# Patient Record
Sex: Female | Born: 1935 | Race: White | Hispanic: No | Marital: Married | Smoking: Never smoker
Health system: Southern US, Community
[De-identification: ages and names within clinical notes are randomized; demographics above are authoritative.]

## PROBLEM LIST (undated history)

## (undated) DIAGNOSIS — I10 Essential (primary) hypertension: Secondary | ICD-10-CM

## (undated) DIAGNOSIS — E039 Hypothyroidism, unspecified: Secondary | ICD-10-CM

## (undated) DIAGNOSIS — F419 Anxiety disorder, unspecified: Secondary | ICD-10-CM

## (undated) DIAGNOSIS — E079 Disorder of thyroid, unspecified: Secondary | ICD-10-CM

## (undated) DIAGNOSIS — R569 Unspecified convulsions: Secondary | ICD-10-CM

## (undated) DIAGNOSIS — F0390 Unspecified dementia without behavioral disturbance: Secondary | ICD-10-CM

## (undated) HISTORY — DX: Unspecified convulsions: R56.9

## (undated) HISTORY — PX: NO PAST SURGERIES: SHX2092

---

## 1998-04-28 ENCOUNTER — Other Ambulatory Visit: Admission: RE | Admit: 1998-04-28 | Discharge: 1998-04-28 | Payer: Self-pay

## 1999-06-25 ENCOUNTER — Other Ambulatory Visit: Admission: RE | Admit: 1999-06-25 | Discharge: 1999-06-25 | Payer: Self-pay | Admitting: Internal Medicine

## 2000-07-04 ENCOUNTER — Other Ambulatory Visit: Admission: RE | Admit: 2000-07-04 | Discharge: 2000-07-04 | Payer: Self-pay | Admitting: Internal Medicine

## 2001-08-03 ENCOUNTER — Encounter: Payer: Self-pay | Admitting: Emergency Medicine

## 2001-08-03 ENCOUNTER — Emergency Department (HOSPITAL_COMMUNITY): Admission: EM | Admit: 2001-08-03 | Discharge: 2001-08-03 | Payer: Self-pay | Admitting: Emergency Medicine

## 2002-12-03 ENCOUNTER — Emergency Department (HOSPITAL_COMMUNITY): Admission: EM | Admit: 2002-12-03 | Discharge: 2002-12-03 | Payer: Self-pay | Admitting: Emergency Medicine

## 2002-12-03 ENCOUNTER — Encounter: Payer: Self-pay | Admitting: Emergency Medicine

## 2006-09-19 ENCOUNTER — Ambulatory Visit (HOSPITAL_COMMUNITY): Admission: RE | Admit: 2006-09-19 | Discharge: 2006-09-19 | Payer: Self-pay | Admitting: Internal Medicine

## 2006-10-06 ENCOUNTER — Ambulatory Visit: Payer: Self-pay | Admitting: Internal Medicine

## 2007-09-23 ENCOUNTER — Encounter: Admission: RE | Admit: 2007-09-23 | Discharge: 2007-09-23 | Payer: Self-pay | Admitting: Internal Medicine

## 2009-08-28 ENCOUNTER — Inpatient Hospital Stay (HOSPITAL_COMMUNITY): Admission: EM | Admit: 2009-08-28 | Discharge: 2009-09-01 | Payer: Self-pay | Admitting: Emergency Medicine

## 2009-12-10 ENCOUNTER — Inpatient Hospital Stay (HOSPITAL_COMMUNITY): Admission: RE | Admit: 2009-12-10 | Discharge: 2009-12-12 | Payer: Self-pay | Admitting: Orthopedic Surgery

## 2010-08-15 LAB — CBC
MCH: 30.7 pg (ref 26.0–34.0)
Platelets: 105 10*3/uL — ABNORMAL LOW (ref 150–400)
RDW: 13.3 % (ref 11.5–15.5)
WBC: 5.7 10*3/uL (ref 4.0–10.5)

## 2010-08-15 LAB — BASIC METABOLIC PANEL
Chloride: 105 mEq/L (ref 96–112)
GFR calc Af Amer: >60 mL/min (ref >60)
GFR calc non Af Amer: >60 mL/min (ref >60)
Glucose, Bld: 137 mg/dL — ABNORMAL HIGH (ref 70–99)
Potassium: 3.9 mEq/L (ref 3.5–5.1)

## 2010-08-16 LAB — TYPE AND SCREEN
ABO/RH(D): A POS
Antibody Screen: NEGATIVE

## 2010-08-16 LAB — BASIC METABOLIC PANEL
BUN: 12 mg/dL (ref 6–23)
BUN: 13 mg/dL (ref 6–23)
CO2: 30 mEq/L (ref 19–32)
Calcium: 8.7 mg/dL (ref 8.4–10.5)
Calcium: 9.6 mg/dL (ref 8.4–10.5)
Creatinine, Ser: 0.76 mg/dL (ref 0.4–1.2)
GFR calc Af Amer: >60 mL/min (ref >60)
GFR calc non Af Amer: >60 mL/min (ref >60)
Potassium: 4 mEq/L (ref 3.5–5.1)
Sodium: 138 mEq/L (ref 135–145)
Sodium: 140 mEq/L (ref 135–145)

## 2010-08-16 LAB — SURGICAL PCR SCREEN: MRSA, PCR: NEGATIVE

## 2010-08-16 LAB — ABO/RH: ABO/RH(D): A POS

## 2010-08-16 LAB — CBC
MCH: 30.7 pg (ref 26.0–34.0)
MCV: 89.9 fL (ref 78.0–100.0)
RBC: 4.7 MIL/uL (ref 3.87–5.11)
RDW: 13.4 % (ref 11.5–15.5)
WBC: 5.9 10*3/uL (ref 4.0–10.5)
WBC: 7.2 10*3/uL (ref 4.0–10.5)

## 2010-08-16 LAB — URINALYSIS, ROUTINE W REFLEX MICROSCOPIC
Glucose, UA: NEGATIVE mg/dL
Nitrite: NEGATIVE
Protein, ur: NEGATIVE mg/dL
Urobilinogen, UA: 0.2 mg/dL (ref 0.0–1.0)
pH: 5.5 (ref 5.0–8.0)

## 2010-08-16 LAB — URINE MICROSCOPIC-ADD ON

## 2010-08-16 LAB — PROTIME-INR
INR: 1.01 (ref 0.00–1.49)
Prothrombin Time: 13.2 seconds (ref 11.6–15.2)

## 2010-08-16 LAB — DIFFERENTIAL
Eosinophils Absolute: 0 10*3/uL (ref 0.0–0.7)
Neutro Abs: 3.7 10*3/uL (ref 1.7–7.7)

## 2010-08-19 LAB — PROTIME-INR
INR: 1.52 — ABNORMAL HIGH (ref 0.00–1.49)
Prothrombin Time: 14.1 seconds (ref 11.6–15.2)
Prothrombin Time: 18.6 seconds — ABNORMAL HIGH (ref 11.6–15.2)

## 2010-08-19 LAB — BASIC METABOLIC PANEL
CO2: 27 mEq/L (ref 19–32)
CO2: 29 mEq/L (ref 19–32)
Calcium: 8.3 mg/dL — ABNORMAL LOW (ref 8.4–10.5)
Chloride: 104 mEq/L (ref 96–112)
Chloride: 106 mEq/L (ref 96–112)
Creatinine, Ser: 0.6 mg/dL (ref 0.4–1.2)
Creatinine, Ser: 0.66 mg/dL (ref 0.4–1.2)
GFR calc Af Amer: >60 mL/min (ref >60)
GFR calc Af Amer: >60 mL/min (ref >60)
Glucose, Bld: 116 mg/dL — ABNORMAL HIGH (ref 70–99)
Glucose, Bld: 196 mg/dL — ABNORMAL HIGH (ref 70–99)
Sodium: 136 mEq/L (ref 135–145)
Sodium: 138 mEq/L (ref 135–145)

## 2010-08-19 LAB — CBC
HCT: 32.7 % — ABNORMAL LOW (ref 36.0–46.0)
MCHC: 34 g/dL (ref 30.0–36.0)
Platelets: 130 10*3/uL — ABNORMAL LOW (ref 150–400)
RBC: 3.58 MIL/uL — ABNORMAL LOW (ref 3.87–5.11)
RDW: 12.8 % (ref 11.5–15.5)

## 2010-08-24 LAB — COMPREHENSIVE METABOLIC PANEL
ALT: 16 U/L (ref 0–35)
AST: 24 U/L (ref 0–37)
Alkaline Phosphatase: 86 U/L (ref 39–117)
CO2: 27 mEq/L (ref 19–32)
Chloride: 104 mEq/L (ref 96–112)
GFR calc Af Amer: >60 mL/min (ref >60)
GFR calc non Af Amer: >60 mL/min (ref >60)
Sodium: 139 mEq/L (ref 135–145)
Total Bilirubin: 0.6 mg/dL (ref 0.3–1.2)

## 2010-08-24 LAB — CBC
Hemoglobin: 14.2 g/dL (ref 12.0–15.0)
RBC: 4.61 MIL/uL (ref 3.87–5.11)
WBC: 6.4 10*3/uL (ref 4.0–10.5)

## 2010-08-24 LAB — URINALYSIS, ROUTINE W REFLEX MICROSCOPIC
Glucose, UA: NEGATIVE mg/dL
Hgb urine dipstick: NEGATIVE
Ketones, ur: NEGATIVE mg/dL
Protein, ur: NEGATIVE mg/dL

## 2010-08-24 LAB — DIFFERENTIAL
Basophils Absolute: 0 10*3/uL (ref 0.0–0.1)
Basophils Relative: 1 % (ref 0–1)
Eosinophils Absolute: 0.1 10*3/uL (ref 0.0–0.7)
Eosinophils Relative: 1 % (ref 0–5)

## 2010-08-24 LAB — PROTIME-INR: Prothrombin Time: 13.4 seconds (ref 11.6–15.2)

## 2010-08-24 LAB — URINE CULTURE: Culture: NO GROWTH

## 2010-10-13 NOTE — Assessment & Plan Note (Signed)
[facility]                         GASTROENTEROLOGY OFFICE NOTE   NAME:Cathy Morton, Cathy Morton                       MRN:          [id number]  DATE:10/06/2006                            DOB:          [birth year]    REFERRING PHYSICIAN:  Geoffry Paradise, M.D.   REFERRING PHYSICIAN:  Geoffry Paradise, M.D.   REASON FOR CONSULTATION:  Elevated liver tests.   HISTORY:  This is an 77 year old white female with a history of  hypertension and hypothyroidism who is referred through the courtesy of  Dr. Jacky Kindle regarding elevated liver function tests.  The patient had  blood work drawn in March.  Her alkaline phosphatase was 181; the upper  limits of normal is 137.  Her transaminases and bilirubin were normal.  Gamma GT was elevated to confirm liver origin.  Repeat alkaline  phosphatase was 146.  She underwent a number of studies including  hepatitis serologies, TSH, antinuclear antibodies and antimitochondrial  antibodies.  These studies were negative or normal.  The patient also  underwent an ultrasound of the liver, which was normal in both size and  echotexture.  No space occupying lesions and no biliary ductal  dilatation were noted.   The patient denies to me any symptoms.  In particular she denies nausea,  vomiting, abdominal pain, jaundice, fever, and weight loss; actually,  she has had some weight gain.  Her appetite is good.  The patient was  noted to have low B12 level for which she is now on replacement therapy.   The patient's review of systems is also otherwise negative including  change in bowel habits, melena or hematochezia.  The patient also denies  prior history of liver problems or family history of liver disease.  No  personal history of hepatitis.   PAST MEDICAL HISTORY:  1. Hypertension.  2. Hypothyroidism.   PAST SURGICAL HISTORY:  Thyroidectomy (extent unknown).   ALLERGIES:  No known drug allergies.   CURRENT MEDICATIONS:  1.  Synthroid 100 mcg daily.  2. Bisoprolol 25/625mg  daily.  3. Move Free.  4. B12 injections.  5. The patient also takes Tylenol Arthritis.   FAMILY HISTORY:  The family history is negative for liver disease or  malignancy.   SOCIAL HISTORY:  The patient is married with one son.  She is  accompanied by her husband.  She is retired from [employer].  She has a  seventh grade education.  She does not smoke or use alcohol.   REVIEW OF SYSTEMS:  The review of systems is per diagnostic evaluation  form.   PHYSICAL EXAMINATION:  GENERAL APPEARANCE:  This is an elderly female in  no acute distress.  VITAL SIGNS:  Blood pressure 140/80, heart rate 82, weight is 133  pounds, and she is 5 feet 3 inches in height.  HEENT:  Sclerae anicteric.  Conjunctivae are pink.  Oral mucosa is  intact.  NECK:  The neck has no adenopathy.  LUNGS:  The lungs are clear.  HEART:  The heart is regular.  ABDOMEN:  The abdomen is soft without tenderness, mass or hernia.  No  organomegaly.  Good bowel sounds are heard.  EXTREMITIES:  The extremities are without edema.   IMPRESSION:  This is an 77 year old female with a mild elevation of her  alkaline phosphatase with a negative ultrasound and supplemental  laboratories as described.  The patient is asymptomatic.   At this point I would not pursue further workup.  It would be reasonable  to check her liver tests every six months to confirm stability.  If no  there is no significant change then, again, I would not pursue this  further.  She will return to the care of Dr. Jacky Kindle.     Wilhemina Bonito. Marina Goodell, MD  Electronically Signed    JNP/MedQ  DD: 10/06/2006  DT: 10/06/2006  Job #: [id number]   cc:   Geoffry Paradise, M.D.

## 2011-04-01 IMAGING — CR DG HIP 1V PORT*L*
1 series · 1 of 1 positions shown · non-contrast
Comparison: 08/29/2009, 12/10/2009

CLINICAL DATA: Left hip pain, left hip arthroplasty revision

PORTABLE LEFT HIP - 1 VIEW

[AP]
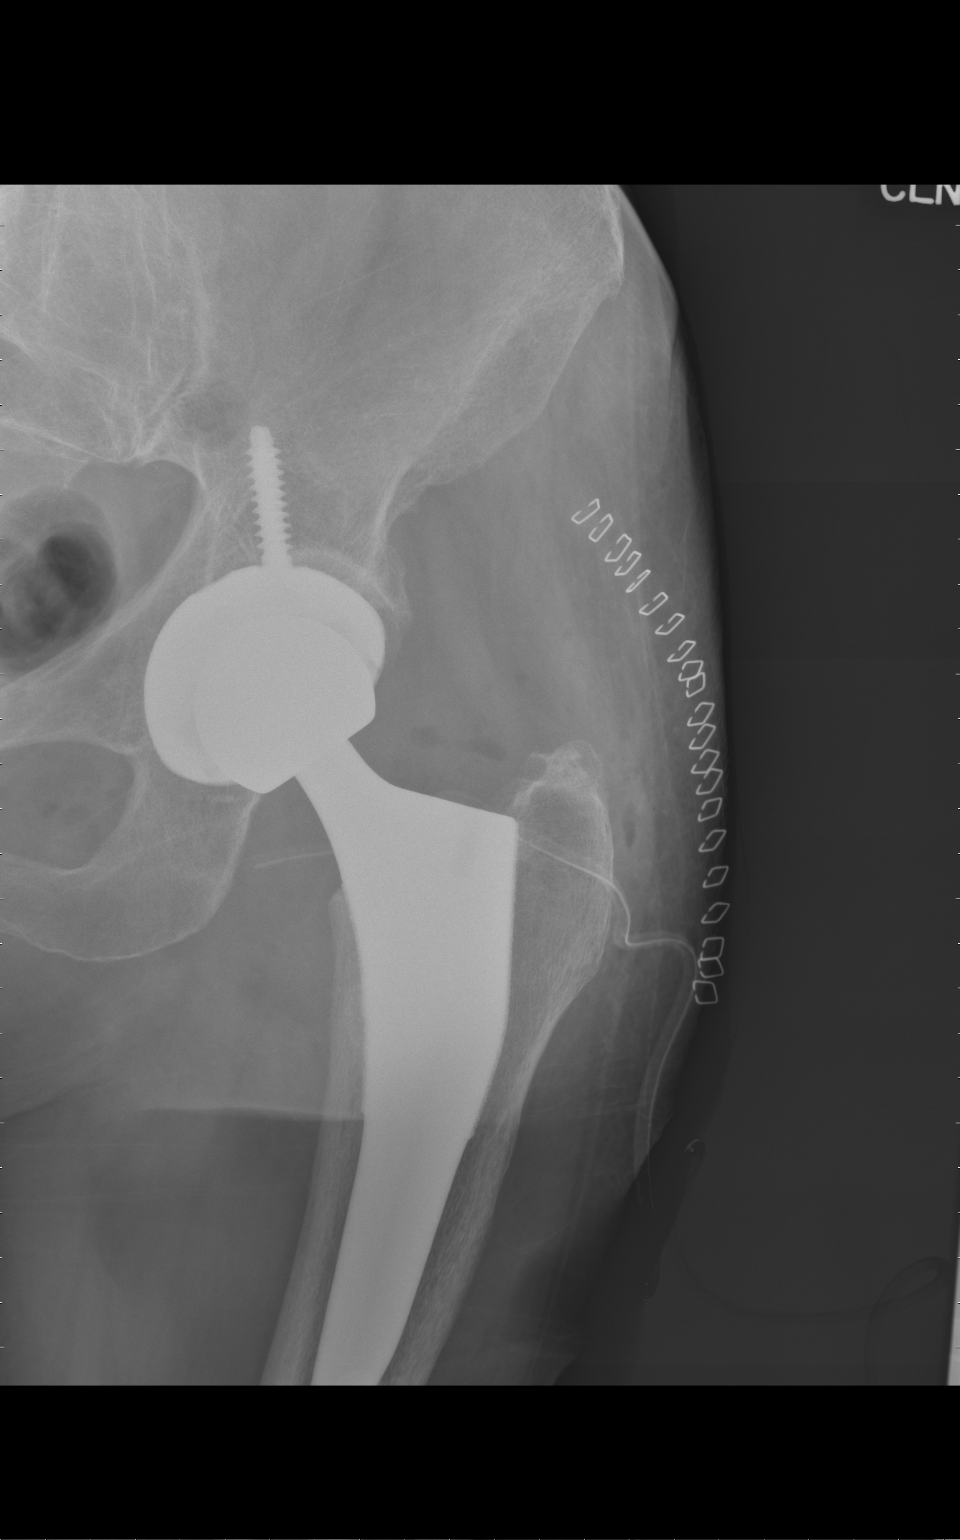

[1 of 1 positions shown; findings below may reference images not displayed]

FINDINGS: Revision of the left hip arthroplasty has been performed.
Postop changes noted with staples laterally and a surgical drain.
Components appear aligned in the frontal plane.
IMPRESSION: Expected postoperative appearance left total hip arthroplasty
revision.

## 2016-01-28 ENCOUNTER — Encounter (HOSPITAL_COMMUNITY): Payer: Self-pay | Admitting: Emergency Medicine

## 2016-01-28 ENCOUNTER — Observation Stay (HOSPITAL_COMMUNITY)
Admission: EM | Admit: 2016-01-28 | Discharge: 2016-01-29 | Disposition: A | Discharge status: Home / Self Care | Payer: Medicare Other | Attending: Internal Medicine | Admitting: Internal Medicine

## 2016-01-28 ENCOUNTER — Emergency Department (HOSPITAL_COMMUNITY): Payer: Medicare Other

## 2016-01-28 DIAGNOSIS — F329 Major depressive disorder, single episode, unspecified: Secondary | ICD-10-CM | POA: Diagnosis not present

## 2016-01-28 DIAGNOSIS — W010XXA Fall on same level from slipping, tripping and stumbling without subsequent striking against object, initial encounter: Secondary | ICD-10-CM | POA: Diagnosis not present

## 2016-01-28 DIAGNOSIS — M545 Low back pain: Secondary | ICD-10-CM | POA: Diagnosis present

## 2016-01-28 DIAGNOSIS — S32010A Wedge compression fracture of first lumbar vertebra, initial encounter for closed fracture: Secondary | ICD-10-CM | POA: Diagnosis present

## 2016-01-28 DIAGNOSIS — Z79899 Other long term (current) drug therapy: Secondary | ICD-10-CM | POA: Diagnosis not present

## 2016-01-28 DIAGNOSIS — F419 Anxiety disorder, unspecified: Secondary | ICD-10-CM | POA: Diagnosis present

## 2016-01-28 DIAGNOSIS — F0390 Unspecified dementia without behavioral disturbance: Secondary | ICD-10-CM | POA: Diagnosis present

## 2016-01-28 DIAGNOSIS — S32000A Wedge compression fracture of unspecified lumbar vertebra, initial encounter for closed fracture: Secondary | ICD-10-CM | POA: Diagnosis present

## 2016-01-28 DIAGNOSIS — E039 Hypothyroidism, unspecified: Secondary | ICD-10-CM | POA: Diagnosis present

## 2016-01-28 DIAGNOSIS — R262 Difficulty in walking, not elsewhere classified: Secondary | ICD-10-CM | POA: Insufficient documentation

## 2016-01-28 DIAGNOSIS — S32019A Unspecified fracture of first lumbar vertebra, initial encounter for closed fracture: Secondary | ICD-10-CM | POA: Diagnosis not present

## 2016-01-28 DIAGNOSIS — Z66 Do not resuscitate: Secondary | ICD-10-CM | POA: Diagnosis not present

## 2016-01-28 DIAGNOSIS — I10 Essential (primary) hypertension: Secondary | ICD-10-CM | POA: Diagnosis not present

## 2016-01-28 HISTORY — DX: Unspecified dementia, unspecified severity, without behavioral disturbance, psychotic disturbance, mood disturbance, and anxiety: F03.90

## 2016-01-28 HISTORY — DX: Essential (primary) hypertension: I10

## 2016-01-28 HISTORY — DX: Anxiety disorder, unspecified: F41.9

## 2016-01-28 HISTORY — DX: Hypothyroidism, unspecified: E03.9

## 2016-01-28 HISTORY — DX: Disorder of thyroid, unspecified: E07.9

## 2016-01-28 LAB — CBC WITH DIFFERENTIAL/PLATELET
BASOS ABS: 0 10*3/uL (ref 0.0–0.1)
BASOS PCT: 1 %
EOS ABS: 0.1 10*3/uL (ref 0.0–0.7)
Eosinophils Relative: 1 %
HCT: 39.2 % (ref 36.0–46.0)
Hemoglobin: 13 g/dL (ref 12.0–15.0)
Lymphocytes Relative: 24 %
Lymphs Abs: 1.1 10*3/uL (ref 0.7–4.0)
MCH: 29.5 pg (ref 26.0–34.0)
MCHC: 33.2 g/dL (ref 30.0–36.0)
MCV: 88.9 fL (ref 78.0–100.0)
MONO ABS: 0.4 10*3/uL (ref 0.1–1.0)
MONOS PCT: 9 %
NEUTROS PCT: 65 %
Neutro Abs: 2.9 10*3/uL (ref 1.7–7.7)
Platelets: 188 10*3/uL (ref 150–400)
RBC: 4.41 MIL/uL (ref 3.87–5.11)
RDW: 12.7 % (ref 11.5–15.5)
WBC: 4.5 10*3/uL (ref 4.0–10.5)

## 2016-01-28 LAB — BASIC METABOLIC PANEL
ANION GAP: 9 (ref 5–15)
BUN: 10 mg/dL (ref 6–20)
CALCIUM: 9.2 mg/dL (ref 8.9–10.3)
CO2: 29 mmol/L (ref 22–32)
CREATININE: 0.72 mg/dL (ref 0.44–1.00)
Chloride: 96 mmol/L — ABNORMAL LOW (ref 101–111)
GLUCOSE: 99 mg/dL (ref 65–99)
Potassium: 3.5 mmol/L (ref 3.5–5.1)
Sodium: 134 mmol/L — ABNORMAL LOW (ref 135–145)

## 2016-01-28 MED ORDER — ENOXAPARIN SODIUM 40 MG/0.4ML ~~LOC~~ SOLN
40.0000 mg | SUBCUTANEOUS | Status: DC
  Administered 2016-01-28: 40 mg via SUBCUTANEOUS
  Filled 2016-01-28: qty 0.4

## 2016-01-28 MED ORDER — ACETAMINOPHEN 650 MG RE SUPP: 650.0000 mg | Freq: Four times a day (QID) | RECTAL | Status: DC | PRN

## 2016-01-28 MED ORDER — DONEPEZIL HCL 5 MG PO TABS
5.0000 mg | ORAL_TABLET | Freq: Every day | ORAL | Status: DC
  Administered 2016-01-28: 5 mg via ORAL
  Filled 2016-01-28: qty 1

## 2016-01-28 MED ORDER — ALPRAZOLAM 0.25 MG PO TABS
0.2500 mg | ORAL_TABLET | Freq: Four times a day (QID) | ORAL | Status: DC
  Administered 2016-01-28 – 2016-01-29 (×4): 0.25 mg via ORAL
  Filled 2016-01-28 (×4): qty 1

## 2016-01-28 MED ORDER — KETOROLAC TROMETHAMINE 15 MG/ML IJ SOLN
15.0000 mg | Freq: Four times a day (QID) | INTRAMUSCULAR | Status: DC | PRN
  Administered 2016-01-28: 15 mg via INTRAVENOUS
  Filled 2016-01-28: qty 1

## 2016-01-28 MED ORDER — LOSARTAN POTASSIUM-HCTZ 100-12.5 MG PO TABS: 1.0000 | ORAL_TABLET | Freq: Every day | ORAL | Status: DC

## 2016-01-28 MED ORDER — TRAMADOL HCL 50 MG PO TABS
50.0000 mg | ORAL_TABLET | Freq: Four times a day (QID) | ORAL | Status: DC | PRN
  Administered 2016-01-29 (×2): 50 mg via ORAL
  Filled 2016-01-28 (×2): qty 1

## 2016-01-28 MED ORDER — MORPHINE SULFATE (PF) 4 MG/ML IV SOLN
4.0000 mg | Freq: Once | INTRAVENOUS | Status: AC
  Administered 2016-01-28: 4 mg via INTRAVENOUS
  Filled 2016-01-28: qty 1

## 2016-01-28 MED ORDER — HYDROCHLOROTHIAZIDE 12.5 MG PO CAPS
12.5000 mg | ORAL_CAPSULE | Freq: Every day | ORAL | Status: DC
  Administered 2016-01-28 – 2016-01-29 (×2): 12.5 mg via ORAL
  Filled 2016-01-28 (×2): qty 1

## 2016-01-28 MED ORDER — CALCITONIN (SALMON) 200 UNIT/ACT NA SOLN
1.0000 | Freq: Every day | NASAL | Status: DC
  Administered 2016-01-28: 1 via NASAL
  Filled 2016-01-28: qty 3.7

## 2016-01-28 MED ORDER — ACETAMINOPHEN 325 MG PO TABS: 650.0000 mg | ORAL_TABLET | Freq: Four times a day (QID) | ORAL | Status: DC | PRN

## 2016-01-28 MED ORDER — LOSARTAN POTASSIUM 50 MG PO TABS
100.0000 mg | ORAL_TABLET | Freq: Every day | ORAL | Status: DC
  Administered 2016-01-28 – 2016-01-29 (×2): 100 mg via ORAL
  Filled 2016-01-28 (×2): qty 2

## 2016-01-28 MED ORDER — LEVOTHYROXINE SODIUM 100 MCG PO TABS
100.0000 ug | ORAL_TABLET | Freq: Every day | ORAL | Status: DC
  Administered 2016-01-29: 100 ug via ORAL
  Filled 2016-01-28 (×2): qty 1

## 2016-01-28 NOTE — Progress Notes (Signed)
Occupational Therapy Evaluation Patient Details Name: Cathy Morton MRN: [id number] DOB: [birth year] Today's Date: 01/28/2016    History of Present Illness 82 y.o. female with medical history significant of essential HTN, hypothyroidism, dementia, anxiety who presented to the ED after a fall at home 9 days ago.CT - Acute compression fx of right L1 vertebral body   Clinical Impression   PTA, pt lived alone and was independent with ADL and mobility. Only began using a RW @ 1 week ago. Her husband of 770 recently passed away after a fall off of a ladder. Pt able to ambulate and complete ADL with min A @ RW level and stated walking acutually made her back feel better. Recommend pt D/C home with 24/7 S and HHOT/PT and home health Aide. Son is interested in getting information about private care services and "life alert" systems. Will follow acutely to facilitate safe D/C home.     Follow Up Recommendations  Home health OT;Supervision/Assistance - 24 hour;Other (comment) (home health Aide)    Equipment Recommendations  3 in 1 bedside comode;Other (comment) (RW)    Recommendations for Other Services       Precautions / Restrictions Precautions Precautions: Back;Fall Precaution Booklet Issued: No Precaution Comments: back precuaitons for comfort Restrictions Weight Bearing Restrictions: No      Mobility Bed Mobility -  S with HOB elevated @ 30 degrees. Educated pt on back precautions for bed mobility.                  Transfers Overall transfer level: Needs assistance Equipment used: Rolling walker (2 wheeled) Transfers: Sit to/from Stand Sit to Stand: Min guard              Balance Overall balance assessment: Needs assistance;History of Falls   Sitting balance-Leahy Scale: Good       Standing balance-Leahy Scale: Fair                              ADL Overall ADL's : Needs assistance/impaired     Grooming: Standing;Set up;Min guard   Upper  Body Bathing: Set up;Supervision/ safety;Sitting   Lower Body Bathing: Minimal assistance;Sit to/from stand   Upper Body Dressing : Minimal assistance;Sitting   Lower Body Dressing: Minimal assistance;Sit to/from stand   Toilet Transfer: Minimal assistance;BSC;Ambulation;RW (over toilet)   Toileting- Clothing Manipulation and Hygiene: Min guard;Sit to/from stand       Functional mobility during ADLs: Minimal assistance;Rolling walker;Cueing for safety  May benefit form use of reacher. Needs fall prevention education       Vision     Perception     Praxis      Pertinent Vitals/Pain Pain Assessment: Faces Faces Pain Scale: Hurts little more Pain Location: back Pain Descriptors / Indicators: Discomfort;Grimacing;Dull Pain Intervention(s): Limited activity within patient's tolerance;Repositioned;Premedicated before session     Hand Dominance Right   Extremity/Trunk Assessment Upper Extremity Assessment Upper Extremity Assessment: Generalized weakness   Lower Extremity Assessment Lower Extremity Assessment: Generalized weakness   Cervical / Trunk Assessment Cervical / Trunk Assessment: Kyphotic   Communication Communication Communication: HOH   Cognition Arousal/Alertness: Awake/alert Behavior During Therapy: WFL for tasks assessed/performed Overall Cognitive Status: History of cognitive impairments - at baseline (pt lived alone and son checked on her daily)                     General Comments       Exercises  Shoulder Instructions      Home Living Family/patient expects to be discharged to:: Private residence Living Arrangements: Alone Available Help at Discharge: Family;Available 24 hours/day Type of Home: House Home Access: Stairs to enter [employer] of Steps: 3 (back entrance) Entrance Stairs-Rails: Right Home Layout: One level     Bathroom Shower/Tub: Tub only   Firefighter: Standard Bathroom Accessibility:  Yes How Accessible: Accessible via walker Home Equipment: None          Prior Functioning/Environment Level of Independence: Independent        Comments: walked without AD until @ 1 week ago. Fell then went back outside to rake leaves. Husband of 70 years recently passed away.    OT Diagnosis: Generalized weakness;Acute pain   OT Problem List: Decreased strength;Decreased activity tolerance;Impaired balance (sitting and/or standing);Decreased safety awareness;Decreased knowledge of use of DME or AE;Decreased knowledge of precautions;Pain   OT Treatment/Interventions: Self-care/ADL training;DME and/or AE instruction;Therapeutic activities;Patient/family education;Balance training    OT Goals(Current goals can be found in the care plan section) Acute Rehab OT Goals Patient Stated Goal: to not be in pain and return home OT Goal Formulation: With patient/family Time For Goal Achievement: 02/11/16 Potential to Achieve Goals: Good ADL Goals Pt Will Perform Lower Body Bathing: with set-up;with supervision;with caregiver independent in assisting;sit to/from stand Pt Will Perform Lower Body Dressing: with set-up;with supervision;with caregiver independent in assisting;sit to/from stand Pt Will Transfer to Toilet: with supervision;ambulating;bedside commode Pt Will Perform Toileting - Clothing Manipulation and hygiene: with supervision;sit to/from stand Additional ADL Goal #1: Pt/family will independently  verbalize use of 3 back precuations to reduce pain during ADL  OT Frequency: Min 2X/week   Barriers to D/C:            Co-evaluation              End of Session Equipment Utilized During Treatment: Gait belt;Rolling walker Nurse Communication: Mobility status  Activity Tolerance: Patient tolerated treatment well Patient left: in bed;with call bell/phone within reach;with bed alarm set;with family/visitor present   Time: 4098-1191 OT Time Calculation (min): 38  min Charges:  OT General Charges $OT Visit: 1 Procedure OT Evaluation $OT Eval Moderate Complexity: 1 Procedure OT Treatments $Self Care/Home Management : 8-22 mins G-Codes: OT G-codes **NOT FOR INPATIENT CLASS** Functional Assessment Tool Used: clinical judgement Functional Limitation: Self care Self Care Current Status (Y7829): At least 20 percent but less than 40 percent impaired, limited or restricted Self Care Goal Status (F6213): At least 1 percent but less than 20 percent impaired, limited or restricted  Cathy Morton,HILLARY 01/28/2016, 5:04 PM   [facility], OTR/L  [phone] 01/28/2016

## 2016-01-28 NOTE — Care Management Note (Signed)
Case Management Note  Patient Details  Name: Cathy Morton MRN: [id number] Date of Birth: 1923/03/18  Subjective/Objective:                  82 y.o. female with medical history significant of essential HTN, hypothyroidism, dementia, anxiety who presented to the ED after a fall at home 9 days ago. [name]/From home.  Action/Plan: Follow for disposition needs.   Expected Discharge Date:  01/30/16               Expected Discharge Plan:  Home w Home Health Services  In-House Referral:  NA  Discharge planning Services  CM Consult  Post Acute Care Choice:    Choice offered to:     DME Arranged:    DME Agency:     HH Arranged:    HH Agency:     Status of Service:  In process, will continue to follow  If discussed at Long Length of Stay Meetings, dates discussed:    Additional Comments:  Oletta CohnWood, Lyden Redner, RN 01/28/2016, 2:40 PM

## 2016-01-28 NOTE — Progress Notes (Signed)
Patient transferred to [phone]. Patient is oriented to self, location, situation, states she does not know time. BP elevated, patient states she is in "10/10". Daily meds to be given. Will recheck blood pressure. MD notified that patient is on floor.

## 2016-01-28 NOTE — ED Notes (Signed)
Placed order for pt. Lunch tray 

## 2016-01-28 NOTE — ED Notes (Addendum)
Pt. Presenting this AM from home for concern over continued lower back pain since fall last Monday. Pt. Was seen by PCP and prescribed oral medication for pain with limited improvement. Pt. Had CT of lumbar spine today and found to have probable acute fracture to R L1 vertebral body. Pt. Typically ambulates with walker at home. Pt. Has used bed pan for this RN during shift. Pt. Son at bedside. Pt. AxO x4. Pt. Admitted as medsurg observation. Disposition plan to control pt pain and begin PT/OT.

## 2016-01-28 NOTE — ED Triage Notes (Signed)
Pt arrives via EMS from home with fall 9 days ago. Seen by PMD two days later given scripts cymbalta and tramadol. Pt reports taking all week with little relief of pain. Pt able to walk with a walker. Pts pain is in lower back. 20g L FA, of fentanyl PTA.

## 2016-01-28 NOTE — ED Provider Notes (Signed)
[facility]-EMERGENCY DEPT Provider Note   CSN: [id number] Arrival date & time: 01/28/16  1191     History   Chief Complaint Chief Complaint  Patient presents with  . Fall    HPI Cathy Morton is a 82 y.o. female.  Patient is a 82 year old female with past medical history of hypertension, hypothyroidism, and early dementia. She presents for evaluation of severe low back pain. This is been ongoing for one week and started shortly after a fall. She was apparently cleaning at home when she lost her balance and fell. She was seen by her primary care doctor initially and told that she had no fractures on her x-rays. Her pain has worsened over the past several days to the point where she is having difficulty walking and moving. She denies any bowel or bladder complaints. She denies any numbness, weakness, or tingling in her legs.   The history is provided by the patient and a relative (Her son).  Fall  This is a new problem. Episode onset: 1 week ago. The problem occurs constantly. The problem has been gradually worsening. Exacerbated by: Movement and palpation. Nothing relieves the symptoms. Treatments tried: Flexeril and tramadol. The treatment provided no relief.    Past Medical History:  Diagnosis Date  . Dementia   . Hypertension   . Thyroid disease     There are no active problems to display for this patient.   No past surgical history on file.  OB History    No data available       Home Medications    Prior to Admission medications   Not on File    Family History No family history on file.  Social History Social History  Substance Use Topics  . Smoking status: Never Smoker  . Smokeless tobacco: Not on file  . Alcohol use No     Allergies   Review of patient's allergies indicates no known allergies.   Review of Systems Review of Systems  All other systems reviewed and are negative.    Physical Exam Updated Vital Signs BP 176/79 (BP Location: Left  Arm)   Pulse 64   Temp 97.4 F (36.3 C) (Oral)   Resp 15   SpO2 95%   Physical Exam  Constitutional: She is oriented to person, place, and time. She appears well-developed and well-nourished. No distress.  HENT:  Head: Normocephalic and atraumatic.  Neck: Normal range of motion. Neck supple.  Cardiovascular: Normal rate and regular rhythm.  Exam reveals no gallop and no friction rub.   No murmur heard. Pulmonary/Chest: Effort normal and breath sounds normal. No respiratory distress. She has no wheezes.  Abdominal: Soft. Bowel sounds are normal. She exhibits no distension. There is no tenderness.  Musculoskeletal: Normal range of motion.  There is tenderness to palpation in the lumbar soft tissues, however no bony tenderness or step-off.  Neurological: She is alert and oriented to person, place, and time. She exhibits normal muscle tone.  Strength is 5 out of 5 in both lower extremities. Sensation is intact throughout.  Skin: Skin is warm and dry. She is not diaphoretic.  Nursing note and vitals reviewed.    ED Treatments / Results  Labs (all labs ordered are listed, but only abnormal results are displayed) Labs Reviewed  BASIC METABOLIC PANEL  CBC WITH DIFFERENTIAL/PLATELET    EKG  EKG Interpretation None       Radiology No results found.  Procedures Procedures (including critical care time)  Medications Ordered in  ED Medications - No data to display   Initial Impression / Assessment and Plan / ED Course  I have reviewed the triage vital signs and the nursing notes.  Pertinent labs & imaging results that were available during my care of the patient were reviewed by me and considered in my medical decision making (see chart for details).  Clinical Course    CT scan of the lumbar spine reveals an L1 compression fracture which I suspect is the cause of her discomfort. She is having difficulty taking care of herself at home and cannot ambulate without severe  pain. For this reason, I spoken with the hospitalist service regarding admission for pain control and physical therapy. She will be admitted under the care of Dr. Alvino Chapelhoi.  Final Clinical Impressions(s) / ED Diagnoses   Final diagnoses:  None    New Prescriptions New Prescriptions   No medications on file     Geoffery Lyonsouglas Jenniffer Vessels, MD 01/28/16 1455

## 2016-01-28 NOTE — Progress Notes (Signed)
Pt doing fairly well from pain standpoint. Ate approximately 25% of dinner tray.   Will add intranasal calcitonin for additional non-narcotic pain relief  Nutrition consult placed for additional assistance  Of note I took care of pts husband of 70 years when he came into the ED and passed approximately 6 months ago. Pt has had a general decline in overall health and strength since that time. Considerable weight loss.   If not improving in near future a palliative care consult may be warranted for GOC.   Shelly Flattenavid Chipper Koudelka, MD [facility] 01/28/2016, 6:09 PM

## 2016-01-28 NOTE — ED Notes (Signed)
Pt. Assisted with bedpan and pericare.

## 2016-01-28 NOTE — H&P (Signed)
History and Physical    [name]:[id number] DOB: 27-Mar-1923 DOA: 01/28/2016  PCP: Minda Meo, MD Patient coming from: home   Chief Complaint: back pain  HPI: Cathy Morton is a 82 y.o. female with medical history significant of essential HTN, hypothyroidism, dementia, anxiety who presented to the ED after a fall at home 9 days ago. Her son provides most of the history. She is independent at home and fell on Monday. Son believes patient tripped and fell down, no report of hitting her head or loss of consciousness. She went about her day and raked leaves. On Wednesday, she had an appointment with PCP for her annual physical exam and had a back xray, which was negative for acute process. Patient was given Cymbalta and Tramadol for pain control, but states that they have not helped at all. Today, patient complained of severe pain and unable to get out of bed, so her son decided to bring her to the hospital. She denies any fevers, chills, chest pain, SOB, cough, abdominal pain, nausea, vomiting, diarrhea, urinary retention, saddle anesthesia, bowel incontinence. Her main complaint is severe and sharp low back pain.   Patient's husband passed away recently after a prolonged hospitalization. Since then, patient has been very depressed and had poor appetite. She has lost weight due to not eating much at home.   ED Course: CT lumbar spine completed. Morphine administered with pain relief.   Review of Systems: As per HPI otherwise 10 point review of systems negative.    Past Medical History:  Diagnosis Date  . Anxiety   . Dementia   . Hypertension   . Hypothyroidism   . Thyroid disease     Past Surgical History:  Procedure Laterality Date  . NO PAST SURGERIES        reports that she has never smoked. She does not have any smokeless tobacco history on file. She reports that she does not drink alcohol or use drugs.  No Known Allergies  Family history reviewed and not  pertinent   Prior to Admission medications   Medication Sig Start Date End Date Taking? Authorizing Provider  ALPRAZolam (XANAX) 0.25 MG tablet Take 0.25 mg by mouth every 6 (six) hours.  01/23/16  Yes Historical Provider, MD  donepezil (ARICEPT) 5 MG tablet Take 5 mg by mouth at bedtime. 01/26/16  Yes Historical Provider, MD  DULoxetine (CYMBALTA) 30 MG capsule Take 30 mg by mouth daily. 01/23/16  Yes Historical Provider, MD  Ginkgo Biloba 60 MG CAPS Take 60 mg by mouth 2 (two) times daily.   Yes Historical Provider, MD  levothyroxine (SYNTHROID, LEVOTHROID) 100 MCG tablet Take 100 mcg by mouth daily before breakfast.   Yes Historical Provider, MD  losartan-hydrochlorothiazide (HYZAAR) 100-12.5 MG tablet Take 1 tablet by mouth daily. 11/21/15  Yes Historical Provider, MD  Multiple Vitamins-Minerals (ICAPS PO) Take 1 capsule by mouth daily.   Yes Historical Provider, MD  traMADol (ULTRAM) 50 MG tablet Take 50 mg by mouth every 6 (six) hours as needed for pain. 01/21/16  Yes Historical Provider, MD    Physical Exam:  Vitals:   01/28/16 1000 01/28/16 1100 01/28/16 1200 01/28/16 1230  BP: 187/74 140/81 117/73 124/74  Pulse: (!) 57 (!) 57 (!) 55 (!) 55  Resp: 15 14 15 20   Temp:      TempSrc:      SpO2: 100% 97% 100% 96%   Constitutional: NAD, calm, comfortable Eyes: PERRL, lids and conjunctivae normal ENMT: Mucous membranes  are dry. Posterior pharynx clear of any exudate or lesions. Upper and lower dentures present. Hard of hearing  Neck: normal, supple, no masses, no thyromegaly Respiratory: clear to auscultation bilaterally, no wheezing, no crackles. Normal respiratory effort. No accessory muscle use.  Cardiovascular: Bradycardic, regular rhythm, no murmurs / rubs / gallops. No extremity edema. 2+ pedal pulses.  Abdomen: no tenderness, no masses palpated. No hepatosplenomegaly. Bowel sounds positive.  Musculoskeletal: no clubbing / cyanosis. No joint deformity upper and lower extremities.  Good ROM, no contractures. Normal muscle tone.  Skin: no rashes, lesions, ulcers. No induration Neurologic: CN 2-12 grossly intact. Sensation intact, DTR normal. Strength 5/5 in all 4.  Psychiatric: Normal judgment and insight. Alert and oriented x 3. Normal mood.   Labs on Admission: I have personally reviewed following labs and imaging studies  CBC:  Recent Labs Lab 01/28/16 0933  WBC 4.5  NEUTROABS 2.9  HGB 13.0  HCT 39.2  MCV 88.9  PLT 188   Basic Metabolic Panel:  Recent Labs Lab 01/28/16 0933  NA 134*  K 3.5  CL 96*  CO2 29  GLUCOSE 99  BUN 10  CREATININE 0.72  CALCIUM 9.2   GFR: CrCl cannot be calculated (Unknown ideal weight.). Liver Function Tests: No results for input(s): AST, ALT, ALKPHOS, BILITOT, PROT, ALBUMIN in the last 168 hours. No results for input(s): LIPASE, AMYLASE in the last 168 hours. No results for input(s): AMMONIA in the last 168 hours. Coagulation Profile: No results for input(s): INR, PROTIME in the last 168 hours. Cardiac Enzymes: No results for input(s): CKTOTAL, CKMB, CKMBINDEX, TROPONINI in the last 168 hours. BNP (last 3 results) No results for input(s): PROBNP in the last 8760 hours. HbA1C: No results for input(s): HGBA1C in the last 72 hours. CBG: No results for input(s): GLUCAP in the last 168 hours. Lipid Profile: No results for input(s): CHOL, HDL, LDLCALC, TRIG, CHOLHDL, LDLDIRECT in the last 72 hours. Thyroid Function Tests: No results for input(s): TSH, T4TOTAL, FREET4, T3FREE, THYROIDAB in the last 72 hours. Anemia Panel: No results for input(s): VITAMINB12, FOLATE, FERRITIN, TIBC, IRON, RETICCTPCT in the last 72 hours. Urine analysis:    Component Value Date/Time   COLORURINE YELLOW 12/10/2009 1640   APPEARANCEUR CLEAR 12/10/2009 1640   LABSPEC 1.020 12/10/2009 1640   PHURINE 5.5 12/10/2009 1640   GLUCOSEU NEGATIVE 12/10/2009 1640   HGBUR NEGATIVE 12/10/2009 1640   BILIRUBINUR NEGATIVE 12/10/2009 1640    KETONESUR 15 (A) 12/10/2009 1640   PROTEINUR NEGATIVE 12/10/2009 1640   UROBILINOGEN 0.2 12/10/2009 1640   NITRITE NEGATIVE 12/10/2009 1640   LEUKOCYTESUR MODERATE (A) 12/10/2009 1640   No results found for this or any previous visit (from the past 240 hour(s)).   Radiological Exams on Admission: Ct Lumbar Spine Wo Contrast  Result Date: 01/28/2016 CLINICAL DATA:  Severe low back pain secondary to a recent fall. EXAM: CT LUMBAR SPINE WITHOUT CONTRAST TECHNIQUE: Multidetector CT imaging of the lumbar spine was performed without intravenous contrast administration. Multiplanar CT image reconstructions were also generated. COMPARISON:  Lumbar MRI dated 09/22/2017 FINDINGS: There is a subtle compression deformity of the right side of the L1 vertebral body which is new since the prior study not see discrete fracture line. This is most apparent on the coronal images. There is slight soft tissue haziness anterior and to the right of the L2 vertebra consistent with slight hemorrhage. No other discrete fractures in the lumbar spine. T12-L1:  Disc appears normal. L1-2:  Disc appears normal. L2-3: Broad-based  disc bulge with no neural impingement. Slight hypertrophy of the ligamentum flavum combine to create mild spinal stenosis. L3-4: Small broad-based disc bulge with slight hypertrophy of the ligamentum flavum and facet joints which combine to create mild spinal stenosis. No focal neural impingement. L4-5: Minimal broad-based disc bulge with no neural impingement. Slight hypertrophy of the ligamentum flavum creating mild spinal stenosis. L5-S1:  Minimal central disc bulge with no neural impingement. IMPRESSION: Subtle compression fracture of the right side of the L1 vertebral body, most likely acute. No other acute abnormalities.  Multilevel mild spinal stenosis. Electronically Signed   By: Francene BoyersJames  Maxwell M.D.   On: 01/28/2016 11:08   Assessment/Plan Principal Problem:   Compression fracture of first lumbar  vertebra (HCC) Active Problems:   Benign essential HTN   Hypothyroid   Anxiety   Dementia  Acute compression fx of right L1 vertebral body, after fall at home   Pain control  PT/OT    Fall precautions    Essential HTN  Hyzaar 100-12.5mg  daily   Dementia  Aricept 5mg  qhs   Hypothyroidism  Synthroid 100mcg daily   Anxiety   Xanax 0.25mg  q6h   DVT prophylaxis: Lovenox   Code Status: DNR Family Communication: [name], only son, DPOA  Disposition Plan: Next 24-48 hours Consults called: None Admission status: Observation   Jordan HawksJennifer Chahn-Yang Kadejah Sandiford DO [facility] Pager [phone]  If 7PM-7AM, please contact night-coverage [email or web address] Password [email or web address]  01/28/2016, 1:30 PM

## 2016-01-29 DIAGNOSIS — E039 Hypothyroidism, unspecified: Secondary | ICD-10-CM | POA: Diagnosis not present

## 2016-01-29 DIAGNOSIS — F419 Anxiety disorder, unspecified: Secondary | ICD-10-CM | POA: Diagnosis not present

## 2016-01-29 DIAGNOSIS — I10 Essential (primary) hypertension: Secondary | ICD-10-CM

## 2016-01-29 DIAGNOSIS — F0390 Unspecified dementia without behavioral disturbance: Secondary | ICD-10-CM

## 2016-01-29 MED ORDER — ACETAMINOPHEN 325 MG PO TABS: 650.0000 mg | ORAL_TABLET | Freq: Four times a day (QID) | ORAL | Status: DC | PRN

## 2016-01-29 MED ORDER — TRAMADOL HCL 50 MG PO TABS: 50.0000 mg | ORAL_TABLET | Freq: Four times a day (QID) | ORAL | Status: DC | PRN

## 2016-01-29 MED ORDER — ALPRAZOLAM 0.25 MG PO TABS: 0.2500 mg | ORAL_TABLET | Freq: Every day | ORAL | Status: DC

## 2016-01-29 NOTE — Care Management Obs Status (Signed)
MEDICARE OBSERVATION STATUS NOTIFICATION   Patient Details  Name: Cathy Morton MRN: [id number] Date of Birth: 22-Jun-1922   Medicare Observation Status Notification Given:  Yes    Kermit BaloKelli F Kutter Schnepf, RN 01/29/2016, 4:15 PM

## 2016-01-29 NOTE — Evaluation (Signed)
Physical Therapy Evaluation Patient Details Name: Cathy Morton MRN: [id number] DOB: [birth year] Today's Date: 01/29/2016   History of Present Illness  82 y.o. female with medical history significant of essential HTN, hypothyroidism, dementia, anxiety who presented to the ED after a fall at home 9 days ago.CT - Acute compression fx of right L1 vertebral body  Clinical Impression  Patient presents with generalized weakness, balance deficits and impaired mobility s/p above. Education re: back precautions and how to use a RW. Pt independent PTA and has great support from son. Pt HOH and does not have hearing aides during session. Tolerated gait training with min-Min guard assist for stability. Pt will need initial 24/7 S for safety. Will plan for stair training tomorrow as tolerated. Will follow acutely to maximize independence and mobility prior to return home.    Follow Up Recommendations Home health PT;Supervision for mobility/OOB    Equipment Recommendations  Rolling walker with 5" wheels    Recommendations for Other Services       Precautions / Restrictions Precautions Precautions: Back;Fall Precaution Booklet Issued: No Precaution Comments: back precauitons for comfort Restrictions Weight Bearing Restrictions: No      Mobility  Bed Mobility Overal bed mobility: Needs Assistance Bed Mobility: Rolling;Sidelying to Sit Rolling: Min guard Sidelying to sit: Min guard;HOB elevated       General bed mobility comments: Cues for log roll technique. Use of rail, increased time.   Transfers Overall transfer level: Needs assistance Equipment used: Rolling walker (2 wheeled) Transfers: Sit to/from Stand Sit to Stand: Min guard         General transfer comment: Min guard to steady in standing. Pt pulling up on RW despite cues. Stood from [employer], from toilet x1, transferred to chair post ambulation bout.  Ambulation/Gait Ambulation/Gait assistance: Min assist;Min  guard Ambulation Distance (Feet): 120 Feet Assistive device: Rolling walker (2 wheeled) Gait Pattern/deviations: Step-through pattern;Decreased stride length;Narrow base of support;Trunk flexed;Drifts right/left Gait velocity: decreased   General Gait Details: Slow, mildly unsteady. Drifting noted with some instances of instability requiring Min A at times for balance. Pt just got up from sleeping. Cues for RW management.   Stairs            Wheelchair Mobility    Modified Rankin (Stroke Patients Only)       Balance Overall balance assessment: Needs assistance Sitting-balance support: Feet supported;No upper extremity supported Sitting balance-Leahy Scale: Good Sitting balance - Comments: Able to perform pericare without LOB.   Standing balance support: During functional activity Standing balance-Leahy Scale: Poor Standing balance comment: Reliant on UEs for support in standing. LOB posteriorly when trying to donn underwear requiring Min A.                              Pertinent Vitals/Pain Pain Assessment: No/denies pain    Home Living Family/patient expects to be discharged to:: Private residence Living Arrangements: Alone Available Help at Discharge: Family;Available 24 hours/day Type of Home: House Home Access: Stairs to enter Entrance Stairs-Rails: Right Entrance Stairs-Number of Steps: 3 (back entrance) Home Layout: One level Home Equipment: None      Prior Function Level of Independence: Independent         Comments: walked without AD until @ 1 week ago. Fell then went back outside to rake leaves. Husband of 70 years recently passed away.     Hand Dominance   Dominant Hand: Right    Extremity/Trunk  Assessment   Upper Extremity Assessment: Defer to OT evaluation;Generalized weakness           Lower Extremity Assessment: Generalized weakness      Cervical / Trunk Assessment: Kyphotic  Communication   Communication: HOH   Cognition Arousal/Alertness: Awake/alert Behavior During Therapy: WFL for tasks assessed/performed Overall Cognitive Status: History of cognitive impairments - at baseline                      General Comments General comments (skin integrity, edema, etc.): Pt does not have her hearing aids today.    Exercises        Assessment/Plan    PT Assessment Patient needs continued PT services  PT Diagnosis Difficulty walking;Generalized weakness   PT Problem List Decreased strength;Decreased mobility;Decreased balance;Decreased activity tolerance;Decreased knowledge of precautions;Decreased cognition;Decreased knowledge of use of DME  PT Treatment Interventions Therapeutic activities;Gait training;Therapeutic exercise;Patient/family education;Stair training;Balance training;Functional mobility training;DME instruction   PT Goals (Current goals can be found in the Care Plan section) Acute Rehab PT Goals Patient Stated Goal: to not be in pain and return home PT Goal Formulation: With patient Time For Goal Achievement: 02/12/16 Potential to Achieve Goals: Good    Frequency Min 3X/week   Barriers to discharge Inaccessible home environment stairs    Co-evaluation               End of Session Equipment Utilized During Treatment: Gait belt Activity Tolerance: Patient tolerated treatment well Patient left: in chair;with call bell/phone within reach;with chair alarm set Nurse Communication: Mobility status    Functional Assessment Tool Used: clinical judgment Functional Limitation: Mobility: Walking and moving around Mobility: Walking and Moving Around Current Status [phone](G8978): At least 20 percent but less than 40 percent impaired, limited or restricted Mobility: Walking and Moving Around Goal Status [id number](G8979): At least 1 percent but less than 20 percent impaired, limited or restricted    Time: 0724-0745 PT Time Calculation (min) (ACUTE ONLY): 21 min   Charges:   PT  Evaluation $PT Eval Moderate Complexity: 1 Procedure     PT G Codes:   PT G-Codes **NOT FOR INPATIENT CLASS** Functional Assessment Tool Used: clinical judgment Functional Limitation: Mobility: Walking and moving around Mobility: Walking and Moving Around Current Status ([id number](G8978): At least 20 percent but less than 40 percent impaired, limited or restricted Mobility: Walking and Moving Around Goal Status [id number](G8979): At least 1 percent but less than 20 percent impaired, limited or restricted    Chavy Avera A Rigby Leonhardt 01/29/2016, 7:53 AM Mylo RedShauna Venora Kautzman, PT, DPT [phone]

## 2016-01-29 NOTE — Discharge Instructions (Addendum)
Lumbar Fracture A lumbar fracture is a break in one of the bones of the lower back. Lumbar fractures range in severity. Severe fractures can damage the spinal cord. CAUSES This condition may be caused by:  A fall (common).  A car accident (common).  A gunshot wound.  A hard, direct hit to the back.  Osteoporosis. SYMPTOMS The main symptom of this condition is severe pain in the lower back. If a fracture is complex or severe, there may also be:  A misshapen or swollen area on the lower back.  A limited ability to move an area of the lower back.  An inability to empty the bladder or bowel.  A loss of strength or sensation in the legs, feet, and toes.  Paralysis. DIAGNOSIS This condition is diagnosed based on:  A physical exam.  Symptoms and what happened just before they developed.  The results of imaging tests, such as an X-ray, CT scan, or MRI. If your nerves have been damaged, you may also have other tests to find out how much damage there is. TREATMENT Treatment for this condition depends on the specifics of the injury. Most fractures can be treated with:  A back brace.  Bed rest and activity restrictions.  Pain medicine.  Physical therapy. Fractures that are complex, involve multiple bones, or make the spine unstable may require surgery to remove pressure from the nerves or spinal cord and to stabilize the broken pieces of bone. During recovery, it is normal to have pain and stiffness in the back for weeks. HOME CARE INSTRUCTIONS Medicines  Take medicines only as directed by your health care provider.  Do not drive or operate heavy machinery while taking pain medicine. Activity  Stay in bed for as long as directed by your health care provider.  If you were shown how to do any exercises to improve motion and strength in your back, do them as directed by your health care provider.  Return to your normal activities as directed by your health care provider.  Ask your health care provider what activities are safe for you. General Instructions  If you were given a neck brace or back brace, wear it as directed by your health care provider.  Keep all follow-up visits as directed by your health care provider. This is important. Failure to follow-up as recommended could result in permanent injury, disability, and long-lasting (chronic) pain. SEEK MEDICAL CARE IF:  Your pain does not improve over time.  You have a persistent cough.  You cannot return to your normal activities as planned or expected. SEEK IMMEDIATE MEDICAL CARE IF:  You have severe pain or your pain suddenly gets worse.  You are unable to move.  You have numbness, tingling, weakness, or paralysis in any part of your body.  You cannot control your bladder or bowel.  You have difficulty breathing.  You have a fever.  You have pain in your chest or abdomen.  You vomit.   This information is not intended to replace advice given to you by your health care provider. Make sure you discuss any questions you have with your health care provider.   Document Released: 09/01/2006 Document Revised: 10/01/2014 Document Reviewed: 05/13/2014 Elsevier Interactive Patient Education 2016 Elsevier Inc.   Nutrition [facility] Stay Proper nutrition can help your body recover from illness and injury.   Foods and beverages high in protein, vitamins, and minerals help rebuild muscle loss, promote healing, & reduce fall risk.   In addition to eating  healthy foods, a nutrition shake is an easy, delicious way to get the nutrition you need during and after your hospital stay  It is recommended that you continue to drink 2 bottles per day of:       Ensure Enlive for at least 1 month (30 days) after your hospital stay, or until appetite and food intake improve.  Tips for adding a nutrition shake into your routine: As allowed, drink one with vitamins or medications instead of water or  juice Enjoy one as a tasty mid-morning or afternoon snack Drink cold or make a milkshake out of it Drink one instead of milk with cereal or snacks Use as a coffee creamer Blend with fruit or peanut butter to make a smoothie (such as vanilla shake with strawberries, strawberry shake with banana, or chocolate shakes with peanut butter)   Available at the following grocery stores and pharmacies:           * [employer] * [employer] * Costco  * Rite Aid          * Walmart * Sam's Club  * Walgreens      * Target  * BJ's   * CVS  * Lowes Foods   * [facility] [phone]            For COUPONS visit: [email or web address]/join or [email or web address]   Suggested Substitutions Ensure [employer]live = Boost Plus = Production managerCarnation Breakfast Essentials = Boost Compact

## 2016-01-29 NOTE — Progress Notes (Signed)
Discharge orders received, pt for discharge home today with home health PT, IV D/C,  D/C instructions and Rx given with verbalized understanding.  Family at bedside to assist pt with discharge. Staff brought pt downstairs via wheelchair.  

## 2016-01-29 NOTE — Clinical Social Work Note (Signed)
CSW was consulted for discharge planning needs. CSW discussed pt with RNCM, who is following for discharge planning needs, as pt will return home. PT is recommending HHPT. Pt is ready for discharge today. CSW is signing off as no further needs identified.   Dede QuerySarah Priscila Bean, MSW, LCSW  Clinical Social Worker  [phone]

## 2016-01-29 NOTE — Care Management Note (Addendum)
Case Management Note  Patient Details  Name: Cathy Morton MRN: [id number] Date of Birth: [birth year]  Subjective/Objective:                    Action/Plan: Pt discharging home with orders for [facility] services. CM met with the patient and her son and provided them a list of [place] agencies in the [place] area. He selected [place]. Tiffany with [facility] notified. Pt also ordered a 3 in 1 and rolling walker. Jermaine with [facility] delivered the equipment to the room. Pt's son also needed a letter for [place] to help arrange 24/7 care. CM notified MD who provided the letter and CM faxed it to [place] with the number they provided. The letter was then given to the patients son. Bedside RN updated.   Addendum: (1600) Tiffany with [facility] accepted the referral.   Expected Discharge Date:  01/30/16               Expected Discharge Plan:  [place]  In-House Referral:  NA  Discharge planning Services  CM Consult  Post Acute Care Choice:  Durable Medical Equipment, Home Health Choice offered to:  Adult Children  DME Arranged:  3-N-1, Walker rolling DME Agency:  [place] Arranged:  PT, OT, Nurse's Aide [place] Agency:  [place]  Status of Service:  Completed, signed off  If discussed at [place] of Stay Meetings, dates discussed:    Additional Comments:  Pollie Friar, RN 01/29/2016, 1:34 PM

## 2016-01-29 NOTE — Progress Notes (Signed)
Occupational Therapy Treatment Patient Details Name: Cathy Morton MRN: [id number] DOB: [birth year] Today's Date: 01/29/2016    History of present illness 82 y.o. female with medical history significant of essential HTN, hypothyroidism, dementia, anxiety who presented to the ED after a fall at home 9 days ago.CT - Acute compression fx of right L1 vertebral body   OT comments  Completed education with pt/son regarding use of back precautions for ADL and home safety to reduce risk of falls. Continue to recommend pt D/C home with 24/7 S and home health services. Family very appreciative.  Follow Up Recommendations  Home health OT;Supervision/Assistance - 24 hour;Other (comment)    Equipment Recommendations  3 in 1 bedside comode;Other (comment)    Recommendations for Other Services      Precautions / Restrictions Precautions Precautions: Back;Fall Precaution Booklet Issued: Yes (comment)       Mobility Bed Mobility Overal bed mobility: Needs Assistance Bed Mobility: Sidelying to Sit Rolling: Supervision         General bed mobility comments: cues for log rolling   Transfers Overall transfer level: Needs assistance Equipment used: Rolling walker (2 wheeled) Transfers: Sit to/from Stand Sit to Stand: Supervision         General transfer comment: cues for hand placement    Balance             Standing balance-Leahy Scale: Fair                     ADL                   Upper Body Dressing : Set up   Lower Body Dressing: Supervision/safety;Set up;Sit to/from stand               Functional mobility during ADLs: Supervision/safety;Rolling walker;Cueing for safety General ADL Comments: Educated pt/son on compensatory tehcniques for ADL. Completed educaiton regarding home safety and reducing risk f falls. also copleted educatioin regarding back precautions to decrease pain during mobility and ADL,. Pt ableto retrn demonstrate with min vc.        Vision                     Perception     Praxis      Cognition   Behavior During Therapy: WFL for tasks assessed/performed Overall Cognitive Status: History of cognitive impairments - at baseline                       Extremity/Trunk Assessment     general weakness          Exercises     Shoulder Instructions       General Comments  pt with increased mobility and less c/o pain this session    Pertinent Vitals/ Pain       Pain Assessment: Faces Faces Pain Scale: Hurts a little bit Pain Location: back Pain Descriptors / Indicators: Grimacing Pain Intervention(s): Limited activity within patient's tolerance  Home Living                                          Prior Functioning/Environment              Frequency Min 2X/week     Progress Toward Goals  OT Goals(current goals can now be found in the care plan section)  Progress  towards OT goals: Goals met/education completed, patient discharged from OT  Acute Rehab OT Goals Patient Stated Goal: to not be in pain and return home OT Goal Formulation: With patient/family Time For Goal Achievement: 02/11/16 Potential to Achieve Goals: Good ADL Goals Pt Will Perform Lower Body Bathing: with set-up;with supervision;with caregiver independent in assisting;sit to/from stand Pt Will Perform Lower Body Dressing: with set-up;with supervision;with caregiver independent in assisting;sit to/from stand Pt Will Transfer to Toilet: with supervision;ambulating;bedside commode Pt Will Perform Toileting - Clothing Manipulation and hygiene: with supervision;sit to/from stand Additional ADL Goal #1: Pt/family will independently  verbalize use of 3 back precuations to reduce pain during ADL  Plan All goals met and education completed, patient discharged from OT services (acute ot)    Co-evaluation                 End of Session Equipment Utilized During Treatment: Rolling  walker   Activity Tolerance Patient tolerated treatment well   Patient Left in chair;with call bell/phone within reach;with chair alarm set   Nurse Communication Mobility status;Other (comment) (D/C needs)    Functional Assessment Tool Used: clinical judgement Functional Limitation: Self care Self Care Current Status (O6767): At least 1 percent but less than 20 percent impaired, limited or restricted Self Care Goal Status (M0947): At least 1 percent but less than 20 percent impaired, limited or restricted   Time: 1138-1208 OT Time Calculation (min): 30 min  Charges: OT G-codes **NOT FOR INPATIENT CLASS** Functional Assessment Tool Used: clinical judgement Functional Limitation: Self care Self Care Current Status (S9628): At least 1 percent but less than 20 percent impaired, limited or restricted Self Care Goal Status (Z6629): At least 1 percent but less than 20 percent impaired, limited or restricted OT General Charges $OT Visit: 1 Procedure OT Treatments $Self Care/Home Management : 23-37 mins  Abigayl Hor,HILLARY 01/29/2016, 2:13 PM  [facility], OTR/L  [phone] 01/29/2016

## 2016-01-29 NOTE — Discharge Summary (Signed)
Physician Discharge Summary  TRENIYAH LYNN ZOX:[id number] DOB: [birth year]  PCP: Minda Meo, MD  Admit date: 01/28/2016 Discharge date: 01/29/2016  Admitted From: Home Disposition:  Home  Recommendations for Outpatient Follow-up:  1. Dr. Justin Mend, PCP in 1 week.  Home Health: PT, OT & Aide Equipment/Devices: Rolling walker with 5 inch wheels & 3 n 1 bedside commode   Discharge Condition: Improved and stable  CODE STATUS: DO NOT RESUSCITATE  Diet recommendation: Heart healthy diet  Discharge Diagnoses:  Principal Problem:   Compression fracture of first lumbar vertebra (HCC) Active Problems:   Benign essential HTN   Hypothyroid   Anxiety   Dementia   Brief/Interim Summary: 82 year old female, recently widowed, lives alone, independent of activities until recently, PMH of HTN, hypothyroid, dementia, anxiety, presented to the [facility] ED on 01/28/16 after a fall sustained at home 9 days prior to admission on a Monday. [name] believes that patient tripped and fell down. No report of head injury or LOC. She was able to rake leaves in her yard. 2 days later, she had an appointment with her PCP for her annual physical exam and back x-ray at that time was negative for acute process. She was prescribed Cymbalta and tramadol for pain control but did not seem to help. She presented with severe low back pain, inability to get out of bed and her son brought her to the hospital. She apparently has been very depressed associated with poor appetite and some weight loss after her spouses demise.    Low back pain/L1 vertebral fracture: In the ED, CT of the lumbar spine demonstrated acute subtle fracture (detailed report as below). She was admitted for pain control and therapies evaluation. She received a single dose of IV Toradol on the afternoon of admission and since then her pain control has improved. She received a dose of tramadol this morning. She rates her pain as mild. Physical  therapy and occupational therapy have evaluated her and recommend home health PT, OT, Aide, DME as indicated above and 24/7 supervision.Patient has been able to ambulate with the help of supervision and walker with minimal discomfort. I discussed with patient's son Mr. Fizza Scales who lives next door but is planning to move in with his mother for the next couple of days.Pain control was discussed extensively with him. Advised him to provide the Tylenol on a scheduled basis every 6 hours for a couple of days and use the tramadol as needed. If the pain is not adequately controlled as outpatient, may consider using topical treatments such as a lidocaine patch. He verbalized understanding. He also requested a letter for the insurance company to arrange the 24/7 supervision and a letter has been provided. Discussed extensively with patient's RN and case management. Patient likely has osteoporosis which can be evaluated and treated as outpatient as needed.  Essential hypertension: Controlled on current home medications.  Dementia: No agitation. Coherent and appropriate. Continue Aricept.  Hypothyroid: Continue home dose of Synthroid.  Anxiety & depression: Verified with son that patient was only taking Xanax, 1 tablet at bedtime. Continue Cymbalta which was recently started. No suicidal or homicidal ideations reported. Outpatient follow-up with PCP.   Discharge Instructions  Discharge Instructions    Call MD for:  severe uncontrolled pain    Complete by:  As directed   Diet - low sodium heart healthy    Complete by:  As directed   Increase activity slowly    Complete by:  As directed  Medication List    TAKE these medications   acetaminophen 325 MG tablet Commonly known as:  TYLENOL Take 2 tablets (650 mg total) by mouth every 6 (six) hours as needed for mild pain (or Fever >/= 101).   ALPRAZolam 0.25 MG tablet Commonly known as:  XANAX Take 1 tablet (0.25 mg total) by mouth at  bedtime. What changed:  when to take this   donepezil 5 MG tablet Commonly known as:  ARICEPT Take 5 mg by mouth at bedtime.   DULoxetine 30 MG capsule Commonly known as:  CYMBALTA Take 30 mg by mouth daily.   Ginkgo Biloba 60 MG Caps Take 60 mg by mouth 2 (two) times daily.   ICAPS PO Take 1 capsule by mouth daily.   levothyroxine 100 MCG tablet Commonly known as:  SYNTHROID, LEVOTHROID Take 100 mcg by mouth daily before breakfast.   losartan-hydrochlorothiazide 100-12.5 MG tablet Commonly known as:  HYZAAR Take 1 tablet by mouth daily.   traMADol 50 MG tablet Commonly known as:  ULTRAM Take 1 tablet (50 mg total) by mouth every 6 (six) hours as needed for moderate pain or severe pain. What changed:  reasons to take this      Follow-up Information    ARONSON,RICHARD A, MD. Schedule an appointment as soon as possible for a visit in 1 week(s).   Specialty:  Internal Medicine Contact information: [address] [place] [address] [phone]          No Known Allergies   Procedures/Studies: Ct Lumbar Spine Wo Contrast  Result Date: 01/28/2016 CLINICAL DATA:  Severe low back pain secondary to a recent fall. EXAM: CT LUMBAR SPINE WITHOUT CONTRAST TECHNIQUE: Multidetector CT imaging of the lumbar spine was performed without intravenous contrast administration. Multiplanar CT image reconstructions were also generated. COMPARISON:  Lumbar MRI dated 09/22/2017 FINDINGS: There is a subtle compression deformity of the right side of the L1 vertebral body which is new since the prior study not see discrete fracture line. This is most apparent on the coronal images. There is slight soft tissue haziness anterior and to the right of the L2 vertebra consistent with slight hemorrhage. No other discrete fractures in the lumbar spine. T12-L1:  Disc appears normal. L1-2:  Disc appears normal. L2-3: Broad-based disc bulge with no neural impingement. Slight hypertrophy of the  ligamentum flavum combine to create mild spinal stenosis. L3-4: Small broad-based disc bulge with slight hypertrophy of the ligamentum flavum and facet joints which combine to create mild spinal stenosis. No focal neural impingement. L4-5: Minimal broad-based disc bulge with no neural impingement. Slight hypertrophy of the ligamentum flavum creating mild spinal stenosis. L5-S1:  Minimal central disc bulge with no neural impingement. IMPRESSION: Subtle compression fracture of the right side of the L1 vertebral body, most likely acute. No other acute abnormalities.  Multilevel mild spinal stenosis. Electronically Signed   By: Francene Boyers M.D.   On: 01/28/2016 11:08      Subjective: Low back pain better controlled and rates as mild. As per RN, able to ambulate with supervision and a walker with minimal discomfort.  Discharge Exam:  Vitals:   01/29/16 0100 01/29/16 0500 01/29/16 0820 01/29/16 0938  BP: 119/66 122/67 (!) 131/53 (!) (P) 119/39  Pulse: (!) 55 62 63 (!) (P) 57  Resp: 18 16 15  (P) 18  Temp: 98.4 F (36.9 C) 98.6 F (37 C) 98.3 F (36.8 C) (P) 97.8 F (36.6 C)  TempSrc: Oral Oral Oral (P) Oral  SpO2:  100% 100% 100% (P) 100%    General: Pleasant elderly female, initially seen sitting comfortably on chair, and then reported by RN to have ambulated with walker to the bathroom. Subsequently lying comfortably supine in bed. Cardiovascular: S1 & S2 heard, RRR, S1/S2 +. No murmurs, rubs, gallops or clicks. No JVD or pedal edema. Respiratory: Clear to auscultation without wheezing, rhonchi or crackles. No increased work of breathing. Abdominal:  Non distended, non tender & soft. No organomegaly or masses appreciated. Normal bowel sounds heard. CNS: Alert and oriented. No focal deficits. Extremities: no edema, no cyanosis Musculoskeletal system: No acute findings, especially on examination of back.    The results of significant diagnostics from this hospitalization (including  imaging, microbiology, ancillary and laboratory) are listed below for reference.     Microbiology: No results found for this or any previous visit (from the past 240 hour(s)).   Labs: BNP (last 3 results) No results for input(s): BNP in the last 8760 hours. Basic Metabolic Panel:  Recent Labs Lab 01/28/16 0933  NA 134*  K 3.5  CL 96*  CO2 29  GLUCOSE 99  BUN 10  CREATININE 0.72  CALCIUM 9.2   Liver Function Tests: No results for input(s): AST, ALT, ALKPHOS, BILITOT, PROT, ALBUMIN in the last 168 hours. No results for input(s): LIPASE, AMYLASE in the last 168 hours. No results for input(s): AMMONIA in the last 168 hours. CBC:  Recent Labs Lab 01/28/16 0933  WBC 4.5  NEUTROABS 2.9  HGB 13.0  HCT 39.2  MCV 88.9  PLT 188    Time coordinating discharge: Over 30 minutes  SIGNED:  Marcellus ScottHONGALGI,Latrena Benegas, MD, FACP, FHM. [facility] Pager [phone]  If 7PM-7AM, please contact night-coverage [email or web address] Password [email or web address] 01/29/2016, 11:32 AM

## 2016-09-14 ENCOUNTER — Emergency Department (HOSPITAL_COMMUNITY)
Admission: EM | Admit: 2016-09-14 | Discharge: 2016-09-15 | Disposition: A | Discharge status: Home / Self Care | Payer: Medicare Other | Attending: Emergency Medicine | Admitting: Emergency Medicine

## 2016-09-14 ENCOUNTER — Encounter (HOSPITAL_COMMUNITY): Payer: Self-pay | Admitting: Emergency Medicine

## 2016-09-14 ENCOUNTER — Emergency Department (HOSPITAL_COMMUNITY): Payer: Medicare Other

## 2016-09-14 DIAGNOSIS — R4182 Altered mental status, unspecified: Secondary | ICD-10-CM | POA: Diagnosis present

## 2016-09-14 DIAGNOSIS — I10 Essential (primary) hypertension: Secondary | ICD-10-CM | POA: Insufficient documentation

## 2016-09-14 DIAGNOSIS — R569 Unspecified convulsions: Secondary | ICD-10-CM | POA: Diagnosis not present

## 2016-09-14 DIAGNOSIS — E039 Hypothyroidism, unspecified: Secondary | ICD-10-CM | POA: Diagnosis not present

## 2016-09-14 DIAGNOSIS — Z8659 Personal history of other mental and behavioral disorders: Secondary | ICD-10-CM

## 2016-09-14 DIAGNOSIS — Z79899 Other long term (current) drug therapy: Secondary | ICD-10-CM | POA: Insufficient documentation

## 2016-09-14 LAB — CBC WITH DIFFERENTIAL/PLATELET
BASOS PCT: 0 %
Basophils Absolute: 0 10*3/uL (ref 0.0–0.1)
EOS ABS: 0 10*3/uL (ref 0.0–0.7)
EOS PCT: 1 %
HCT: 37.9 % (ref 36.0–46.0)
HEMOGLOBIN: 12.8 g/dL (ref 12.0–15.0)
LYMPHS ABS: 1.6 10*3/uL (ref 0.7–4.0)
Lymphocytes Relative: 29 %
MCH: 30.9 pg (ref 26.0–34.0)
MCHC: 33.8 g/dL (ref 30.0–36.0)
MCV: 91.5 fL (ref 78.0–100.0)
MONO ABS: 0.3 10*3/uL (ref 0.1–1.0)
MONOS PCT: 6 %
Neutro Abs: 3.4 10*3/uL (ref 1.7–7.7)
Neutrophils Relative %: 64 %
Platelets: 177 10*3/uL (ref 150–400)
RBC: 4.14 MIL/uL (ref 3.87–5.11)
RDW: 14.1 % (ref 11.5–15.5)
WBC: 5.4 10*3/uL (ref 4.0–10.5)

## 2016-09-14 LAB — COMPREHENSIVE METABOLIC PANEL
ALK PHOS: 55 U/L (ref 38–126)
ALT: 22 U/L (ref 14–54)
ANION GAP: 9 (ref 5–15)
AST: 28 U/L (ref 15–41)
Albumin: 3.8 g/dL (ref 3.5–5.0)
BILIRUBIN TOTAL: 0.3 mg/dL (ref 0.3–1.2)
BUN: 15 mg/dL (ref 6–20)
CALCIUM: 9.5 mg/dL (ref 8.9–10.3)
CO2: 29 mmol/L (ref 22–32)
CREATININE: 0.88 mg/dL (ref 0.44–1.00)
Chloride: 102 mmol/L (ref 101–111)
GFR calc non Af Amer: 56 mL/min — ABNORMAL LOW (ref >60)
Glucose, Bld: 106 mg/dL — ABNORMAL HIGH (ref 65–99)
Potassium: 3.8 mmol/L (ref 3.5–5.1)
SODIUM: 140 mmol/L (ref 135–145)
TOTAL PROTEIN: 6 g/dL — AB (ref 6.5–8.1)

## 2016-09-14 LAB — URINALYSIS, ROUTINE W REFLEX MICROSCOPIC
BILIRUBIN URINE: NEGATIVE
Glucose, UA: NEGATIVE mg/dL
Hgb urine dipstick: NEGATIVE
KETONES UR: NEGATIVE mg/dL
LEUKOCYTES UA: NEGATIVE
NITRITE: NEGATIVE
PH: 6 (ref 5.0–8.0)
PROTEIN: NEGATIVE mg/dL
Specific Gravity, Urine: 1.006 (ref 1.005–1.030)

## 2016-09-14 NOTE — ED Triage Notes (Signed)
Family reported pt. is " spaced out" with confusion/disorientation this evening ( [id number]) , " not feeling well" /fatigue , they suspect dehydration and UTI .

## 2016-09-15 ENCOUNTER — Emergency Department (HOSPITAL_COMMUNITY): Payer: Medicare Other

## 2016-09-15 LAB — I-STAT TROPONIN, ED: Troponin i, poc: 0.01 ng/mL (ref 0.00–0.08)

## 2016-09-15 NOTE — ED Provider Notes (Addendum)
[facility]-EMERGENCY DEPT Provider Note   CSN: [id number] Arrival date & time: 09/14/16  2052  By signing my name below, I, [name], attest that this documentation has been prepared under the direction and in the presence of Derwood Kaplan, MD.  Electronically Signed: [name], ED Scribe. 09/15/16. 12:34 AM.    History   Chief Complaint Chief Complaint  Patient presents with  . Dehydration  . Confused  . ? UTI    The history is provided by a relative. The history is limited by the condition of the patient. No language interpreter was used.   LEVEL V CAVEAT: HPI and ROS limited due to pt has dementia.   HPI Comments: MARLISE Morton is a 83 y.o. female who has a PMhx of dementia, HTN, hypothyroidism presents to the Emergency Department with altered mental status x 3 days. Pt says that she has generalized fatigue. Pt lives at home beside her son. Per son, pt had two episodes in the span of 3 days of being "spaced out". Son states, for ~ 7 minutes, pt "looked out in space" stating she was "completely out of it". He says that he nudged her and tried to bring her to and she was unresponsive. Pt was confused for ~ 10 minutes after the episode. She has a similar episode ~ 3 days ago. EMS was called this evening and came out to evaluate the pt.They performed an EKG labs performed and informed family to bring pt to the ED for further evaluation. Son also states he believes the pt is dehydrated due to her lack of drinking water. He believes that she may have a current UTI after walking around holding her genitals and being more confused than normal. Per son, pt just recently lost her husband ~ 11 months ago and has not been doing well since. pt repeated states "I'm just confused and I miss him". Pt had a fall ~ 6 months ago that caused a compression fracture of the lumbar vertebrae that has healed appropriately but has not had any recent falls. She has not had any new medications but notes a  recent increase in her doneprezil. Pt is currently at her baseline. Pt has no dysuria, headache, chest pain, vomiting, numbness, weakness, and urinary frequency.   PCP: [facility]ry [facility]  Past Medical History:  Diagnosis Date  . Anxiety   . Dementia   . Hypertension   . Hypothyroidism   . Thyroid disease     Patient Active Problem List   Diagnosis Date Noted  . Benign essential HTN 01/28/2016  . Hypothyroid 01/28/2016  . Anxiety 01/28/2016  . Dementia 01/28/2016  . Compression fracture of first lumbar vertebra (HCC) 01/28/2016    Past Surgical History:  Procedure Laterality Date  . NO PAST SURGERIES      OB History    No data available       Home Medications    Prior to Admission medications   Medication Sig Start Date End Date Taking? Authorizing Provider  acetaminophen (TYLENOL) 325 MG tablet Take 2 tablets (650 mg total) by mouth every 6 (six) hours as needed for mild pain (or Fever >/= 101). 01/29/16   Elease Etienne, MD  ALPRAZolam Prudy Feeler) 0.25 MG tablet Take 1 tablet (0.25 mg total) by mouth at bedtime. 01/29/16   Elease Etienne, MD  donepezil (ARICEPT) 5 MG tablet Take 5 mg by mouth at bedtime. 01/26/16   Historical Provider, MD  DULoxetine (CYMBALTA) 30  MG capsule Take 30 mg by mouth daily. 01/23/16   Historical Provider, MD  Ginkgo Biloba 60 MG CAPS Take 60 mg by mouth 2 (two) times daily.    Historical Provider, MD  levothyroxine (SYNTHROID, LEVOTHROID) 100 MCG tablet Take 100 mcg by mouth daily before breakfast.    Historical Provider, MD  losartan-hydrochlorothiazide (HYZAAR) 100-12.5 MG tablet Take 1 tablet by mouth daily. 11/21/15   Historical Provider, MD  Multiple Vitamins-Minerals (ICAPS PO) Take 1 capsule by mouth daily.    Historical Provider, MD  traMADol (ULTRAM) 50 MG tablet Take 1 tablet (50 mg total) by mouth every 6 (six) hours as needed for moderate pain or severe pain. 01/29/16   Elease Etienne, MD    Family  History No family history on file.  Social History Social History  Substance Use Topics  . Smoking status: Never Smoker  . Smokeless tobacco: Never Used  . Alcohol use No     Allergies   Patient has no known allergies.   Review of Systems Review of Systems  All other systems reviewed and are negative for acute changes except as noted in the HPI.  Physical Exam Updated Vital Signs BP (!) 169/65   Pulse (!) 56   Temp 97.5 F (36.4 C) (Oral)   Resp 14   Ht  (1.626 m)   Wt 104 lb (47.2 kg)   SpO2 100%   BMI 17.85 kg/m   Physical Exam  Constitutional: She is oriented to person, place, and time. She appears well-developed and well-nourished. No distress.  HENT:  Head: Normocephalic and atraumatic.  Eyes: EOM are normal.  Pupils 2mm and equal  Neck: Normal range of motion.  Cardiovascular: Normal rate, regular rhythm and normal heart sounds.   Pulmonary/Chest: Effort normal and breath sounds normal.  Anterior lungs are clear  Abdominal: Soft. She exhibits no distension. There is no tenderness.  Musculoskeletal: Normal range of motion.  No pitting edema appreciated, no TTP over the legs  Neurological: She is alert and oriented to person, place, and time.  Cranial nerves 2-12 intact Moving all 4 extremities  Skin: Skin is warm and dry.  Psychiatric: She has a normal mood and affect. Judgment normal.  Nursing note and vitals reviewed.    ED Treatments / Results  DIAGNOSTIC STUDIES: Oxygen Saturation is 100% on RA, normal by my interpretation.  COORDINATION OF CARE:  12:33 AM Will order CT of head. Discussed treatment plan with pt at bedside and pt agreed to plan.  Labs (all labs ordered are listed, but only abnormal results are displayed) Labs Reviewed  COMPREHENSIVE METABOLIC PANEL - Abnormal; Notable for the following:       Result Value   Glucose, Bld 106 (*)    Total Protein 6.0 (*)    GFR calc non Af Amer 56 (*)    All other components within  normal limits  CBC WITH DIFFERENTIAL/PLATELET  URINALYSIS, ROUTINE W REFLEX MICROSCOPIC  I-STAT TROPOININ, ED    EKG  EKG Interpretation  Date/Time:  Wednesday September 15 2016 01:08:23 EDT Ventricular Rate:  75 PR Interval:    QRS Duration: 135 QT Interval:  525 QTC Calculation: 484 R Axis:   94 Text Interpretation:  Unknown rhythm, irregular rate - likely sinus arrhythmia RBBB and LPFB No acute changes Confirmed by Rhunette Croft, MD, Janey Genta [phone]) on 09/15/2016 1:19:33 AM       Radiology Dg Chest 2 View  Result Date: 09/14/2016 CLINICAL DATA:  Confusion.  Fatigue.  Altered mental status. EXAM: CHEST  2 VIEW COMPARISON:  08/28/2009 FINDINGS: Heart size is normal. There is aortic atherosclerosis. The lungs are clear. No effusions. Surgical clips in the region of the thyroid. Partial compression fracture at L1, age indeterminate. IMPRESSION: No active cardiopulmonary disease. Aortic atherosclerosis. Partial compression fracture L1, age indeterminate. Electronically Signed   By: Paulina Fusi M.D.   On: 09/14/2016 22:01   Ct Head Wo Contrast  Result Date: 09/15/2016 CLINICAL DATA:  Initial evaluation for acute altered mental status, seizure like spell. EXAM: CT HEAD WITHOUT CONTRAST TECHNIQUE: Contiguous axial images were obtained from the base of the skull through the vertex without intravenous contrast. COMPARISON:  None. FINDINGS: Brain: Generalized age-related cerebral atrophy. Mild chronic micro ischemic changes present within the periventricular white matter. No acute intracranial hemorrhage. No evidence for acute large vessel territory infarct. No mass lesion, midline shift or mass effect. No hydrocephalus. No extra-axial fluid collection. Vascular: No hyperdense vessel. Scattered vascular calcifications noted within the carotid siphons. Skull: Scalp soft tissues demonstrate no acute abnormality. Calvarium intact. Sinuses/Orbits: Globes and orbital soft tissues demonstrate no acute  abnormality. Paranasal sinuses are clear. No mastoid effusion. IMPRESSION: 1. No acute intracranial process. 2. Generalized age-related cerebral atrophy with mild chronic small vessel ischemic disease. Electronically Signed   By: Rise Mu M.D.   On: 09/15/2016 01:55    Procedures Procedures (including critical care time)  Medications Ordered in ED Medications - No data to display   Initial Impression / Assessment and Plan / ED Course  I have reviewed the triage vital signs and the nursing notes.  Pertinent labs & imaging results that were available during my care of the patient were reviewed by me and considered in my medical decision making (see chart for details).  Clinical Course as of Sep 16 250  Wed Sep 15, 2016  0251 Results from the ER workup discussed with the patient face to face and all questions answered to the best of my ability.  I informed family that an absent seizure is very likely. However, with 1st time seizure, ER workup is limited to basic workup followed by Neuro f/u if pt is back to baseline, and they are OK with the plan. Strict ER return precautions have been discussed, and patient is agreeing with the plan and is comfortable with the workup done and the recommendations from the ER.   [AN]    Clinical Course User Index [AN] Derwood Kaplan, MD    Pt comes in with cc of seizure. Pt likely had an absent seizure like spell. Son had a video - and it does obvious that it was an absent seizure like spell.  We will get CT head and basic labs and check UA. Pt is back to baseline.  If the workup is neg, we will d/c with Neuro f/u.  Final Clinical Impressions(s) / ED Diagnoses   Final diagnoses:  Seizure (HCC)  Mental status change resolved    New Prescriptions New Prescriptions   No medications on file   I personally performed the services described in this documentation, which was scribed in my presence. The recorded information has been  reviewed and is accurate.    Derwood Kaplan, MD 09/15/16 1191    Derwood Kaplan, MD 09/15/16 4782    Derwood Kaplan, MD 09/15/16 9562

## 2016-09-15 NOTE — Discharge Instructions (Signed)
We saw you in the ER for what appears to have been an absent seizure. All the results in the ER are normal, labs and imaging. We are not sure what is causing your symptoms. The workup in the ER is not complete, and is limited to screening for life threatening and emergent conditions only, so please see the Neurologist as requested.

## 2016-09-29 ENCOUNTER — Ambulatory Visit (INDEPENDENT_AMBULATORY_CARE_PROVIDER_SITE_OTHER): Payer: Medicare Other | Admitting: Neurology

## 2016-09-29 ENCOUNTER — Encounter: Payer: Self-pay | Admitting: Neurology

## 2016-09-29 VITALS — BP 137/68 | HR 63 | Ht 62.0 in | Wt 103.0 lb

## 2016-09-29 DIAGNOSIS — F329 Major depressive disorder, single episode, unspecified: Secondary | ICD-10-CM | POA: Insufficient documentation

## 2016-09-29 DIAGNOSIS — R569 Unspecified convulsions: Secondary | ICD-10-CM | POA: Diagnosis not present

## 2016-09-29 DIAGNOSIS — I10 Essential (primary) hypertension: Secondary | ICD-10-CM | POA: Diagnosis not present

## 2016-09-29 MED ORDER — LACOSAMIDE 100 MG PO TABS: 100.0000 mg | ORAL_TABLET | Freq: Two times a day (BID) | ORAL | 5 refills | Status: DC

## 2016-09-29 MED ORDER — LACOSAMIDE 100 MG PO TABS: 100.0000 mg | ORAL_TABLET | Freq: Two times a day (BID) | ORAL | 0 refills | Status: DC

## 2016-09-29 NOTE — Progress Notes (Signed)
NEUROLOGY CLINIC NEW PATIENT NOTE  NAME: Cathy Morton DOB: [birth year] REFERRING PHYSICIAN: Geoffry Paradise, MD  I saw Cathy Morton as a new consult in the neurovascular clinic today regarding recent ER visit.  HPI: Cathy Morton is a 83 y.o. female who has a PMhx of HTN, hypothyroidism presented for ED discharge follow up. She presented to the ER on 09/14/16 for two episodes of AMS. Son reported that pt had two episodes in the span of 3 days of being "spaced out". Episodes lasted for ~ 7 minutes, pt "looked out in space" stating she was "completely out of it". He nudged her and tried to bring her to but she was unresponsive. At the time she was getting out of the spell, she had one body jerk and then loudly scream "where am I" then she was back to her baseline. In ER, UA showed no UTI. CT head no acute findings. She was discharged home.  Per son, pt just recently lost her husband a year ago and has not been doing well since. pt repeated states "I'm just confused and I miss him". She had her first episode of staring spells soon after her husband died, she had similar episode before going to her husband funeral. Lasted 4-5 min, and came out of it with a jerk and screaming "where am I".   Since pt husband death, pt seems in depression with memory loss and not feeling well. She had very mild cognitive impairment before this. She was started on aricept and recently increased to  daily. Pt had a fall ~ 6 months ago that caused a compression fracture of the lumbar vertebrae that has healed appropriately and has not had any recent falls. Her BP 137/68 today, on Hyzaar.      Past Medical History:  Diagnosis Date  . Anxiety   . Dementia   . Hypertension   . Hypothyroidism   . Thyroid disease    Past Surgical History:  Procedure Laterality Date  . NO PAST SURGERIES     History reviewed. No pertinent family history. Current Outpatient Prescriptions  Medication Sig Dispense Refill  .  donepezil (ARICEPT) 5 MG tablet Take 5 mg by mouth at bedtime.    . DULoxetine (CYMBALTA) 30 MG capsule Take 30 mg by mouth daily.    . Ginkgo Biloba 60 MG CAPS Take 60 mg by mouth 2 (two) times daily.    Marland Kitchen levothyroxine (SYNTHROID, LEVOTHROID) 100 MCG tablet Take 100 mcg by mouth daily before breakfast.    . losartan-hydrochlorothiazide (HYZAAR) 100-12.5 MG tablet Take 1 tablet by mouth daily.    . Multiple Vitamins-Minerals (ICAPS PO) Take 1 capsule by mouth daily.    . Lacosamide (VIMPAT) 100 MG TABS Take 1 tablet (100 mg total) by mouth 2 (two) times daily. 28 tablet 0  . [START ON 10/13/2016] Lacosamide (VIMPAT) 100 MG TABS Take 1 tablet (100 mg total) by mouth 2 (two) times daily. 60 tablet 5   No current facility-administered medications for this visit.    No Known Allergies Social History   Social History  . Marital status: Married    Spouse name: N/A  . Number of children: N/A  . Years of education: N/A   Occupational History  . Not on file.   Social History Main Topics  . Smoking status: Never Smoker  . Smokeless tobacco: Never Used  . Alcohol use No  . Drug use: No  . Sexual activity: Not on file  Other Topics Concern  . Not on file   Social History Narrative  . No narrative on file    Review of Systems Full 14 system review of systems performed and notable only for those listed, all others are neg:  Constitutional:  Weight loss Cardiovascular:  Ear/Nose/Throat:  Hearing loss Skin:  Eyes:   Respiratory:   Gastroitestinal:   Genitourinary:  Hematology/Lymphatic:   Endocrine:  Musculoskeletal:   Allergy/Immunology:   Neurological:  Memory loss, confusion, seizure Psychiatric: depression Sleep:    Physical Exam  Vitals:   09/29/16 0938  BP: 137/68  Pulse: 63    General - Well nourished, well developed, in no apparent distress.  Ophthalmologic - fundi not visualized due to noncooperation.  Cardiovascular - Regular rate and rhythm with no  murmur.   Neck - supple, no nuchal rigidity .  Mental Status -  Level of arousal and orientation to place, and person were intact, not orientated to time or age. Language including expression, naming, repetition, comprehension was assessed and found intact.  Cranial Nerves II - XII - II - Visual field intact OU. III, IV, VI - Extraocular movements intact. V - Facial sensation intact bilaterally. VII - Facial movement intact bilaterally. VIII - hard of hearing & vestibular intact bilaterally. X - Palate elevates symmetrically. XI - Chin turning & shoulder shrug intact bilaterally. XII - Tongue protrusion intact.  Motor Strength - The patient's strength was normal in all extremities and pronator drift was absent.  Bulk was normal and fasciculations were absent.   Motor Tone - Muscle tone was assessed at the neck and appendages and was normal.  Reflexes - The patient's reflexes were normal in all extremities and she had no pathological reflexes.  Sensory - Light touch, temperature/pinprick were assessed and were normal.    Coordination - The patient had normal movements in the hands with no ataxia or dysmetria.  Tremor was absent.  Gait and Station - small stride, stooped posturing.   Imaging  I have personally reviewed the radiological images below and agree with the radiology interpretations.  Ct Head Wo Contrast 09/15/2016 IMPRESSION: 1. No acute intracranial process. 2. Generalized age-related cerebral atrophy with mild chronic small vessel ischemic disease.   Lab Review Cre 0.88   Assessment and Plan:   In summary, Cathy Morton is a 83 y.o. female with PMH of HTN and hypothyroidism presents as new pt for seizure activity. Pt had two staring spells in the last one year after her husband passed away. She did developed depression due to that. For the seizure, will do seizure work up with MRI with and without contrast, EEG and start on vimpat. Also recommend to follow up  with PCP for depression management.   - will start vimpat for seizure control. vimpat  twice a day.  - will do MRI to rule out structural lesion - will do EEG - recommend to follow up with your primary care physician to consider psychology counseling and depression medications for depression management.  - According to [place] law, you can not drive until seizure free for 6 months and under physician's care.  - Please maintain seizure precautions. - follow up in 3 months.   Thank you very much for the opportunity to participate in the care of this patient.  Please do not hesitate to call if any questions or concerns arise.  Orders Placed This Encounter  Procedures  . MR BRAIN W WO CONTRAST    Standing Status:  Future    Standing Expiration Date:   11/29/2017    Order Specific Question:   If indicated for the ordered procedure, I authorize the administration of contrast media per Radiology protocol    Answer:   Yes    Order Specific Question:   Reason for Exam (SYMPTOM  OR DIAGNOSIS REQUIRED)    Answer:   seizure    Order Specific Question:   What is the patient's sedation requirement?    Answer:   No Sedation    Order Specific Question:   Does the patient have a pacemaker or implanted devices?    Answer:   No    Order Specific Question:   Preferred imaging location?    Answer:   Internal    Order Specific Question:   Radiology Contrast Protocol - do NOT remove file path    Answer:   [email or web address]  . EEG adult    Standing Status:   Future    Standing Expiration Date:   09/29/2017    Meds ordered this encounter  Medications  . Lacosamide (VIMPAT) 100 MG TABS    Sig: Take 1 tablet (100 mg total) by mouth 2 (two) times daily.    Dispense:  28 tablet    Refill:  0  . Lacosamide (VIMPAT) 100 MG TABS    Sig: Take 1 tablet (100 mg total) by mouth 2 (two) times daily.    Dispense:  60 tablet    Refill:  5    Patient Instructions  - will start vimpat  for seizure control. vimpat  twice a day.  - will do MRI to rule out structural lesion - will do EEG - recommend to follow up with your primary care physician to consider psychology counseling and depression medications for depression management.  - According to [place] law, you can not drive until seizure free for 6 months and under physician's care.  - Please maintain seizure precautions. Do not participate in activities where a loss of awareness could hurt you or someone else.  No swimming alone, no tub bathing, no hot tubs, no driving, no operating motorized vehicles(cars, ATVs, motorbikes, etc), lawnmowers or power tools.  No standing at heights, such as rooftops, ladders or stairs. No sliding boards, monkey bars or swings, or climbing trees.  Avoid hot objects such as stoves, heaters, open fires.  Wear a helmet when riding a bicycle, scooter, skateboard, etc. and avoid areas of traffic.  Set water heater to 120 degrees or less. - follow up in 3 months.        [name], MD PhD [facility] [address], [address] [place], [place] [address] [phone]

## 2016-09-29 NOTE — Patient Instructions (Signed)
-   will start vimpat for seizure control. vimpat  twice a day.  - will do MRI to rule out structural lesion - will do EEG - recommend to follow up with your primary care physician to consider psychology counseling and depression medications for depression management.  - According to [place] law, you can not drive until seizure free for 6 months and under physician's care.  - Please maintain seizure precautions. Do not participate in activities where a loss of awareness could hurt you or someone else.  No swimming alone, no tub bathing, no hot tubs, no driving, no operating motorized vehicles(cars, ATVs, motorbikes, etc), lawnmowers or power tools.  No standing at heights, such as rooftops, ladders or stairs. No sliding boards, monkey bars or swings, or climbing trees.  Avoid hot objects such as stoves, heaters, open fires.  Wear a helmet when riding a bicycle, scooter, skateboard, etc. and avoid areas of traffic.  Set water heater to 120 degrees or less. - follow up in 3 months.

## 2016-09-30 ENCOUNTER — Telehealth: Payer: Self-pay | Admitting: Neurology

## 2016-09-30 DIAGNOSIS — R569 Unspecified convulsions: Secondary | ICD-10-CM

## 2016-09-30 NOTE — Telephone Encounter (Signed)
Spoke to pt's son, she started taking the VIMPAT and is experiencing dizziness and blurry vision. Wants to know if there is an alternative option.

## 2016-09-30 NOTE — Telephone Encounter (Signed)
The medication was vimpat.

## 2016-09-30 NOTE — Telephone Encounter (Addendum)
Rn call patients son back on dpr.He was unavailable. Pts daughter in law [place] answer the phone and she is on the dpr form to speak with. Rn stated Dr. Roda ShuttersXU was consulted about the new medication causing pt dizzy spells and blurred vision. RN stated per Dr. Roda ShuttersXu, pt needs to stop taking medication, and see if the symptoms resolve. Rn stated per Dr. Roda ShuttersXu he wants to see if the symptoms  resolve, and to call back on Monday.Pts daughter in law Judeth CornfieldStephanie verbalized understanding.

## 2016-09-30 NOTE — Telephone Encounter (Signed)
Rn spoke with Dr. Roda ShuttersXu via telephone. Rn explain patient was having some blurred vision, and dizzy spells with taking 2 vimpat pills. Dr. Roda ShuttersXu stated pt should stop the medication to see if the symptoms resolved,and to call back on MOnday.

## 2016-09-30 NOTE — Telephone Encounter (Signed)
Rn call patients son on DPR about him mom having side affects after take one pill last night, and one pill this morning of vimpat. Pts son he gives his mom her medications as prescribed. PT was prescribed the vimpat on 09/29/2016 at first office visit. PTs son reported his mom is dizzy and had some blurred vision. Rn stated a message will be sent to the work in md.

## 2016-10-04 ENCOUNTER — Other Ambulatory Visit: Payer: Medicare Other

## 2016-10-06 NOTE — Telephone Encounter (Signed)
Cathy CornfieldStephanie (daughter in law) called office to update patient is doing well since discontinuing Vimpat.

## 2016-10-06 NOTE — Telephone Encounter (Signed)
Left vm for patients son or daughter in law on dpr to call back.Pt stop taking the vimpat on 09/30/2016 due to side effects at the advice of Dr. Pearlean BrownieSethi. Family was calling back to give us a update.

## 2016-10-07 NOTE — Telephone Encounter (Signed)
Left 2nd vm for patients son or daughter in law at listed number. Rn left vm to find out how patient was doing. Rn needs to know if her sympts from stopping the vimpat resolve with blurred vision,and dizzy spells.

## 2016-10-12 ENCOUNTER — Ambulatory Visit (INDEPENDENT_AMBULATORY_CARE_PROVIDER_SITE_OTHER): Payer: Medicare Other | Admitting: Neurology

## 2016-10-12 DIAGNOSIS — R569 Unspecified convulsions: Secondary | ICD-10-CM | POA: Diagnosis not present

## 2016-10-12 NOTE — Procedures (Signed)
     History: Cathy Morton is a 83 year old patient with a history of onset of altered mental status that began on 09/14/2016 with events associated with being "spaced out" lasting about 7 minutes. The patient will stare off and be unresponsive. The patient will suddenly returned to normal baseline. The patient is being evaluated for possible seizures.  This is a routine EEG. No skull defects are noted. Medications include Aricept, Cymbalta, Vimpat, Synthroid, Hyzaar, and multiple vitamins.  EEG classification: Dysrhythmia grade 2 left temporal  Description of the recording: The background rhythms of this recording consists of a well modulated medium amplitude alpha rhythm of 9 Hz that is reactive to eye opening and closure. Throughout the recording, the patient appears to have episodic 6 Hz generalized theta frequency slowing that occurs multiple times during the recording. On rare occasions, sharp transients are seen emanating from the left mid temporal region during the episodes of generalized theta slowing. No definite spike or spike-wave discharges are seen. Photic stimulation is performed, this results in a bilateral and symmetric photic driving response. Hyperventilation was not performed. EKG monitor shows no evidence of cardiac rhythm abnormalities with a heart rate of 54.  Impression: This is an abnormal EEG recording secondary to episodic generalized theta slowing and occasional left temporal sharp wave activity. This recording suggests a lowered seizure threshold, possibly with a left brain focus. No electrographic seizures were seen.

## 2016-10-13 NOTE — Telephone Encounter (Signed)
RN call patients son but he was not available, Rn spoke with daughter in law on dpr. Rn stated per Dr. Roda ShuttersXu the eeg was normal. Rn stated pt discontinue 100mg  vimpat  because of dizzy spells, and blurred vision. Pts daughter in law the pts symptoms resolve immediately. Rn stated Dr. Roda ShuttersXu would like to try 50mg  bid if they would like, or they can try something else. Family  Member will discuss it with her husband and call us back. She verbalized understanding.

## 2016-10-13 NOTE — Telephone Encounter (Addendum)
Pt's son called said he rec'd a VM from Dr Roda ShuttersXu last evening reg the EEG results. Pt also needs to speak with RN reg msg left below. He can be reached at [phone]

## 2016-10-14 ENCOUNTER — Ambulatory Visit
Admission: RE | Admit: 2016-10-14 | Discharge: 2016-10-14 | Disposition: A | Discharge status: Home / Self Care | Payer: Medicare Other | Source: Ambulatory Visit | Attending: Neurology | Admitting: Neurology

## 2016-10-14 DIAGNOSIS — R569 Unspecified convulsions: Secondary | ICD-10-CM

## 2016-10-14 MED ORDER — GADOBENATE DIMEGLUMINE 529 MG/ML IV SOLN
10.0000 mL | Freq: Once | INTRAVENOUS | Status: AC | PRN
  Administered 2016-10-14: 10 mL via INTRAVENOUS

## 2016-10-27 MED ORDER — LAMOTRIGINE 25 MG PO TABS: ORAL_TABLET | ORAL | 1 refills | Status: DC

## 2016-10-27 NOTE — Telephone Encounter (Signed)
DR.Xu can you call them back to discuss the MRI results. Also to discuss the medication vimpat that was discontinue, to see if they wanted to try another medication.

## 2016-10-27 NOTE — Addendum Note (Signed)
Addended by: Marvel PlanXU, Derionna Salvador on: 10/27/2016 05:32 PM   Modules accepted: Orders

## 2016-10-27 NOTE — Telephone Encounter (Signed)
Pt son is asking to be called with the update of MRI results, he is aware of EEG but has heard nothing re: MRI, please call.  [place]

## 2016-10-27 NOTE — Telephone Encounter (Signed)
Discussed with son Cathy Morton over the phone. Informed him that pt MRI was normal but EEG was not normal and showed potential seizure focus. She needs AED for seizure control. Since she can not tolerating vimpat, will change to lamictal with slow titration. Since her weight is less than 100lb, will target at 75mg  bid. Son expressed understanding and appreciation.   Cathy PlanJindong Kalese Ensz, MD PhD Stroke Neurology 10/27/2016 5:29 PM  Meds ordered this encounter  Medications  . lamoTRIgine (LAMICTAL) 25 MG tablet    Sig: 25mg  Qhs x 2 wks, then 25mg  bid x 2 wks, then 50mg  bid x 2 wks, and then 75mg  bid afterward.    Dispense:  240 tablet    Refill:  1

## 2016-10-31 ENCOUNTER — Telehealth: Payer: Self-pay | Admitting: Neurology

## 2016-10-31 NOTE — Telephone Encounter (Signed)
Son [name] called this am stating that pt still has seizure episodes with disorientation, not feeling well, confusion. Lasting several minutes. She has had 2-3 episodes since yesterday. She was just stated on lamictal 25mg  Qhs and still have long way to titrate up.   I told him that to restart vimpat that she got in the past with 50mg  bid until she runs out (about in a month time) to help with bridge to lamictal. She had side effect with vimpat 100mg  bid and I hope she is able to tolerate lower dose of vimpat. Son will call back with progress.   Marvel PlanJindong John Vasconcelos, MD PhD Stroke Neurology 10/31/2016 11:36 AM

## 2016-11-21 ENCOUNTER — Emergency Department (HOSPITAL_COMMUNITY)
Admission: EM | Admit: 2016-11-21 | Discharge: 2016-11-21 | Disposition: A | Discharge status: Home / Self Care | Payer: Medicare Other | Attending: Emergency Medicine | Admitting: Emergency Medicine

## 2016-11-21 ENCOUNTER — Ambulatory Visit (HOSPITAL_COMMUNITY)
Admission: EM | Admit: 2016-11-21 | Discharge: 2016-11-21 | Disposition: A | Discharge status: Nursing Home (Includes ICF) | Payer: Medicare Other | Source: Home / Self Care

## 2016-11-21 ENCOUNTER — Emergency Department (HOSPITAL_COMMUNITY): Payer: Medicare Other

## 2016-11-21 ENCOUNTER — Encounter (HOSPITAL_COMMUNITY): Payer: Self-pay | Admitting: Emergency Medicine

## 2016-11-21 DIAGNOSIS — F0390 Unspecified dementia without behavioral disturbance: Secondary | ICD-10-CM | POA: Diagnosis not present

## 2016-11-21 DIAGNOSIS — Y939 Activity, unspecified: Secondary | ICD-10-CM | POA: Insufficient documentation

## 2016-11-21 DIAGNOSIS — Y929 Unspecified place or not applicable: Secondary | ICD-10-CM | POA: Diagnosis not present

## 2016-11-21 DIAGNOSIS — W1839XA Other fall on same level, initial encounter: Secondary | ICD-10-CM | POA: Insufficient documentation

## 2016-11-21 DIAGNOSIS — I10 Essential (primary) hypertension: Secondary | ICD-10-CM | POA: Diagnosis not present

## 2016-11-21 DIAGNOSIS — E039 Hypothyroidism, unspecified: Secondary | ICD-10-CM | POA: Insufficient documentation

## 2016-11-21 DIAGNOSIS — Y999 Unspecified external cause status: Secondary | ICD-10-CM | POA: Insufficient documentation

## 2016-11-21 DIAGNOSIS — S4991XA Unspecified injury of right shoulder and upper arm, initial encounter: Secondary | ICD-10-CM | POA: Diagnosis not present

## 2016-11-21 DIAGNOSIS — M25511 Pain in right shoulder: Secondary | ICD-10-CM

## 2016-11-21 MED ORDER — LIDOCAINE 5 % EX PTCH: 1.0000 | MEDICATED_PATCH | Freq: Two times a day (BID) | CUTANEOUS | 0 refills | Status: AC | PRN

## 2016-11-21 MED ORDER — OXYCODONE HCL 5 MG PO TABS
2.5000 mg | ORAL_TABLET | Freq: Once | ORAL | Status: AC
  Administered 2016-11-21: 2.5 mg via ORAL
  Filled 2016-11-21: qty 1

## 2016-11-21 MED ORDER — DICLOFENAC SODIUM 1 % TD GEL: 2.0000 g | Freq: Three times a day (TID) | TRANSDERMAL | 0 refills | Status: AC | PRN

## 2016-11-21 NOTE — ED Provider Notes (Signed)
[facility]-EMERGENCY DEPT Provider Note   CSN: [id number] Arrival date & time: 11/21/16  2004     History   Chief Complaint Chief Complaint  Patient presents with  . Fall    HPI Cathy Morton is a 83 y.o. female.  HPI   83 yo F with PMHx mild dementia here with R shoulder pain. History somewhat limited 2/2 dementia but family believes pt may have fallen in shower 1-2 weeks ago. Over the last week, she has been complaining of aching, throbbing right shoulder pain. Pain is worse with movement and palpation. No numbness or weakness. She does not recall what happened to it and states it "just hurts." She thinks she may have hit her head too but denies headache. She has some mild right paraspinal pain as well. Otherwise, pt has been at her baseline and   Past Medical History:  Diagnosis Date  . Anxiety   . Dementia   . Hypertension   . Hypothyroidism   . Thyroid disease     Patient Active Problem List   Diagnosis Date Noted  . Seizures (HCC) 09/29/2016  . Reactive depression 09/29/2016  . Essential hypertension 01/28/2016  . Hypothyroid 01/28/2016  . Anxiety 01/28/2016  . Dementia 01/28/2016  . Compression fracture of first lumbar vertebra (HCC) 01/28/2016    Past Surgical History:  Procedure Laterality Date  . NO PAST SURGERIES      OB History    No data available       Home Medications    Prior to Admission medications   Medication Sig Start Date End Date Taking? Authorizing Provider  acetaminophen (TYLENOL) 325 MG tablet Take 650 mg by mouth 3 (three) times daily as needed for moderate pain.   Yes [provider]  donepezil (ARICEPT) 10 MG tablet Take 10 mg by mouth daily. 10/17/16  Yes [provider]  Ginkgo Biloba 60 MG CAPS Take 60 mg by mouth 2 (two) times daily.   Yes [provider]  hydroxypropyl methylcellulose / hypromellose (ISOPTO TEARS / GONIOVISC) 2.5 % ophthalmic solution Place 2 drops into both eyes 3 (three) times daily  as needed for dry eyes.   Yes [provider]  ibuprofen (ADVIL,MOTRIN) 200 MG tablet Take 400 mg by mouth every 6 (six) hours as needed for mild pain or moderate pain.   Yes [provider]  lamoTRIgine (LAMICTAL) 25 MG tablet 25mg  Qhs x 2 wks, then 25mg  bid x 2 wks, then 50mg  bid x 2 wks, and then 75mg  bid afterward. 10/27/16  Yes [name], MD  levothyroxine (SYNTHROID, LEVOTHROID) 100 MCG tablet Take 100 mcg by mouth daily before breakfast.   Yes [provider]  losartan-hydrochlorothiazide (HYZAAR) 100-12.5 MG tablet Take 1 tablet by mouth daily. 11/21/15  Yes [provider]  Multiple Vitamin (MULTIVITAMIN WITH MINERALS) TABS tablet Take 1 tablet by mouth daily.   Yes [provider]  Multiple Vitamins-Minerals (PRESERVISION AREDS 2+MULTI VIT PO) Take 1 tablet by mouth every evening.   Yes [provider]  VIMPAT 100 MG TABS Take 50 mg by mouth 2 (two) times daily. 09/29/16  Yes [provider]  diclofenac sodium (VOLTAREN) 1 % GEL Apply 2 g topically 3 (three) times daily as needed (pain). 11/21/16 11/28/16  Shaune Pollack, MD  lidocaine (LIDODERM) 5 % Place 1 patch onto the skin 2 (two) times daily as needed (pain). Remove & Discard patch within 12 hours or as directed by MD 11/21/16 11/28/16  Shaune Pollack, MD  Family History History reviewed. No pertinent family history.  Social History Social History  Substance Use Topics  . Smoking status: Never Smoker  . Smokeless tobacco: Never Used  . Alcohol use No     Allergies   Patient has no known allergies.   Review of Systems Review of Systems  Musculoskeletal: Positive for arthralgias.  All other systems reviewed and are negative.    Physical Exam Updated Vital Signs BP (!) 172/67 (BP Location: Left Arm)   Pulse 66   Temp 97.5 F (36.4 C) (Oral)   Resp 18   Ht 5\' 3"  (1.6 m)   Wt 46.7 kg (103 lb)   SpO2 97%   BMI 18.25 kg/m   Physical Exam    Constitutional: She appears well-developed and well-nourished. No distress.  HENT:  Head: Normocephalic and atraumatic.  Eyes: Conjunctivae are normal.  Neck: Neck supple.  Mild right paraspinal TTP. No midline deformity or step-offs.  Cardiovascular: Normal rate, regular rhythm and normal heart sounds.  Exam reveals no friction rub.   No murmur heard. Pulmonary/Chest: Effort normal and breath sounds normal. No respiratory distress. She has no wheezes. She has no rales.  Abdominal: She exhibits no distension.  Musculoskeletal: She exhibits no edema.  Neurological: She is alert. She exhibits normal muscle tone.  Skin: Skin is warm. Capillary refill takes less than 2 seconds.  Psychiatric: She has a normal mood and affect.  Nursing note and vitals reviewed.   UPPER EXTREMITY EXAM: RIGHT  INSPECTION & PALPATION: Significant TTP over lateral shoulder, with exquisite pain with internal/external rotation of shoulder. Mild pain with flexion/extension. No deformity. No bruising.  SENSORY: Sensation is intact to light touch in:  Superficial radial nerve distribution (dorsal first web space) Median nerve distribution (tip of index finger)   Ulnar nerve distribution (tip of small finger)     MOTOR:  + Motor posterior interosseous nerve (thumb IP extension) + Anterior interosseous nerve (thumb IP flexion, index finger DIP flexion) + Radial nerve (wrist extension) + Median nerve (palpable firing thenar mass) + Ulnar nerve (palpable firing of first dorsal interosseous muscle)  VASCULAR: 2+ radial pulse Brisk capillary refill < 2 sec, fingers warm and well-perfused  ED Treatments / Results  Labs (all labs ordered are listed, but only abnormal results are displayed) Labs Reviewed - No data to display  EKG  EKG Interpretation None       Radiology Dg Shoulder Right  Result Date: 11/21/2016 CLINICAL DATA:  Patient fell 2 weeks ago.  Pain. EXAM: RIGHT SHOULDER - 2+ VIEW COMPARISON:   None. FINDINGS: There is no evidence of fracture or dislocation. There is no evidence of arthropathy or other focal bone abnormality. Soft tissues are unremarkable. IMPRESSION: Negative. Electronically Signed   By: Gerome Samavid  Williams III M.D   On: 11/21/2016 21:12   Ct Head Wo Contrast  Result Date: 11/21/2016 CLINICAL DATA:  Fall 2 weeks ago and bathtub. EXAM: CT HEAD WITHOUT CONTRAST CT CERVICAL SPINE WITHOUT CONTRAST TECHNIQUE: Multidetector CT imaging of the head and cervical spine was performed following the standard protocol without intravenous contrast. Multiplanar CT image reconstructions of the cervical spine were also generated. COMPARISON:  09/15/2016 FINDINGS: CT HEAD FINDINGS Brain: No evidence of acute infarction, hemorrhage, hydrocephalus, extra-axial collection or mass lesion/mass effect. There is ventricular and sulcal enlargement reflecting mild generalized atrophy. Mild periventricular white matter hypoattenuation is noted consistent chronic microvascular ischemic change. These findings are stable. Vascular: No hyperdense vessel or unexpected calcification. Skull: Normal. Negative for  fracture or focal lesion. Sinuses/Orbits: No acute abnormality of the globes and orbits. Sinuses and mastoid air cells are clear. Other: None. CT CERVICAL SPINE FINDINGS Alignment: Normal. Skull base and vertebrae: No acute fracture. No primary bone lesion or focal pathologic process. Soft tissues and spinal canal: No prevertebral fluid or swelling. No visible canal hematoma. Disc levels: Mild to moderate loss of disc height at C5-C6. Disc bulging noted from C3-C4 through C5-C6. Facet degenerative changes, relatively mild, most evident on the left from C3-C4 through C6-C7. No convincing disc herniation. Upper chest: No acute findings. Status post thyroidectomy. Apical lung scarring. There is dense carotid artery calcifications. Other: None. IMPRESSION: HEAD CT:  No acute intracranial abnormalities.  No skull  fracture. CERVICAL CT:  No fracture or acute finding. Electronically Signed   By: Amie Portland M.D.   On: 11/21/2016 22:51   Ct Cervical Spine Wo Contrast  Result Date: 11/21/2016 CLINICAL DATA:  Fall 2 weeks ago and bathtub. EXAM: CT HEAD WITHOUT CONTRAST CT CERVICAL SPINE WITHOUT CONTRAST TECHNIQUE: Multidetector CT imaging of the head and cervical spine was performed following the standard protocol without intravenous contrast. Multiplanar CT image reconstructions of the cervical spine were also generated. COMPARISON:  09/15/2016 FINDINGS: CT HEAD FINDINGS Brain: No evidence of acute infarction, hemorrhage, hydrocephalus, extra-axial collection or mass lesion/mass effect. There is ventricular and sulcal enlargement reflecting mild generalized atrophy. Mild periventricular white matter hypoattenuation is noted consistent chronic microvascular ischemic change. These findings are stable. Vascular: No hyperdense vessel or unexpected calcification. Skull: Normal. Negative for fracture or focal lesion. Sinuses/Orbits: No acute abnormality of the globes and orbits. Sinuses and mastoid air cells are clear. Other: None. CT CERVICAL SPINE FINDINGS Alignment: Normal. Skull base and vertebrae: No acute fracture. No primary bone lesion or focal pathologic process. Soft tissues and spinal canal: No prevertebral fluid or swelling. No visible canal hematoma. Disc levels: Mild to moderate loss of disc height at C5-C6. Disc bulging noted from C3-C4 through C5-C6. Facet degenerative changes, relatively mild, most evident on the left from C3-C4 through C6-C7. No convincing disc herniation. Upper chest: No acute findings. Status post thyroidectomy. Apical lung scarring. There is dense carotid artery calcifications. Other: None. IMPRESSION: HEAD CT:  No acute intracranial abnormalities.  No skull fracture. CERVICAL CT:  No fracture or acute finding. Electronically Signed   By: Amie Portland M.D.   On: 11/21/2016 22:51     Procedures Procedures (including critical care time)  Medications Ordered in ED Medications  oxyCODONE (Oxy IR/ROXICODONE) immediate release tablet 2.5 mg (2.5 mg Oral Given 11/21/16 2252)     Initial Impression / Assessment and Plan / ED Course  I have reviewed the triage vital signs and the nursing notes.  Pertinent labs & imaging results that were available during my care of the patient were reviewed by me and considered in my medical decision making (see chart for details).     83 yo F with PMhx as above here with right shoulder pain. Plain films negative. Exam is c/f possible rotator cuff injury and pt does have recent falls. Otherwise she is at baseline state of health and mental status. EKG non-ischemic. CT Head/C-Spine neg. Will tx with local analgesia, outpt follow-up.  Final Clinical Impressions(s) / ED Diagnoses   Final diagnoses:  Acute pain of right shoulder  Shoulder injury, right, initial encounter    New Prescriptions Discharge Medication List as of 11/21/2016 11:14 PM    START taking these medications   Details  diclofenac  sodium (VOLTAREN) 1 % GEL Apply 2 g topically 3 (three) times daily as needed (pain)., Starting Sun 11/21/2016, Until Sun 11/28/2016, Print    lidocaine (LIDODERM) 5 % Place 1 patch onto the skin 2 (two) times daily as needed (pain). Remove & Discard patch within 12 hours or as directed by MD, Starting Sun 11/21/2016, Until Sun 11/28/2016, Print         Shaune Pollack, MD 11/22/16 1606

## 2016-11-21 NOTE — Discharge Instructions (Signed)
-   Apply one lidocaine patch to the shoulder. This can be changed every 12 to 24 hours for pain. - If the patches do not improve pain OR if you react to the patches on your skin, try the Voltaren gel. This can be applied similarly, directly to the area of pain. - Continue tylenol 500 to 1000 mg (1-2 pills) every 6 hours as needed - Do NOT wear the arm sling for more than several hours each day; this is to help with comfort but wearing it too much can cause frozen shoulder and worsening pain

## 2016-11-21 NOTE — ED Triage Notes (Signed)
Patient fell two weeks ago in the bath tub. Patient did not complain until this week about right shoulder. Patient did hit her head. Patient has been taking tylenol and it is not working today.

## 2016-12-20 ENCOUNTER — Telehealth: Payer: Self-pay | Admitting: Neurology

## 2016-12-20 DIAGNOSIS — R569 Unspecified convulsions: Secondary | ICD-10-CM

## 2016-12-20 NOTE — Telephone Encounter (Signed)
Patients daughter in law stephanie called and wanted to make the office aware that the patient has been confused recently. The patient was under the impression that her grandson had been in a wreck although he had not. They could not calm her down and then she had another episode like this a few days later. She would like to know if this could be caused by the medication she has recently began taking. Please call and advise.

## 2016-12-20 NOTE — Telephone Encounter (Signed)
Talk with daughter over the phone. Daughters that patient on Thursday has delusional episode sinking her grandson had a car wreck, needing $2000, and trying to call him to let him know about the regimen. Eventually, grandson has come over to her house and to quiet her down.  Again last Saturday afternoon, patient becoming emotional, tremulous, crying, saying that her grandson is in jail, needing $2000, patient daughter arranged grandson to call her back and told her that it was not the truth. However, patient stated "I don't believe you".  After episode, patient back to baseline, cannot remember what she did, and seemed nothing happened. Dr. denies any loss of consciousness, shaking jerking, seizure-like activity. Patient just one week ago increased Lamictal from 50 twice a day to 75 mg twice a day as the normal titrating schedule. Daughter not sure if this is related to medication titration.  I told daughter that I'm not exactly sure whether this is a medication side effect from the medical due to increased dosage, or because patient had a seizure episode due to inadequate medication dose. However, this delusional episode Also go along with patient cognitive impairment. At this time, we are not sure about the exact etiology.   I recommend daughter that put the patient back to the 50 mg twice a day dose, and will do EEG to see if there is any seizure activity. Patient certainly has been very sensitive to medications including Vimpat. Daughter expressed understanding and appreciation. Daughter is going to have vacation next week for 2 weeks, and she is hoping that we can do the EEG before she leaves for vacation.  Marvel PlanJindong Zalaya Astarita, MD PhD Stroke Neurology 12/20/2016 5:34 PM  Orders Placed This Encounter  Procedures  . EEG adult    Standing Status:   Future    Standing Expiration Date:   12/20/2017

## 2017-01-10 ENCOUNTER — Ambulatory Visit (INDEPENDENT_AMBULATORY_CARE_PROVIDER_SITE_OTHER): Payer: Medicare Other | Admitting: Neurology

## 2017-01-10 DIAGNOSIS — R569 Unspecified convulsions: Secondary | ICD-10-CM

## 2017-01-10 NOTE — Procedures (Signed)
    History:  Cathy Morton is a 83 year old patient with a history of events of altered mental status with transient spells of being "spaced out". The patient was found to have an abnormal EEG study in May 2018, this showed left temporal sharp transients, the patient has been treated with Lamictal. The patient is now having events of delusional thinking, she believes that her grandson is in jail. The patient is being evaluated for this issue.  This is a routine EEG. No skull defects are noted. Medications include Aricept, Motrin, Lamictal, Synthroid, Hyzaar, multivitamins, and Vimpat.    EEG classification: Normal awake  Description of the recording: The background rhythms of this recording consists of a fairly well modulated medium amplitude alpha rhythm of 8 Hz that is reactive to eye opening and closure. As the record progresses, the patient appears to remain in the waking state throughout the recording. Photic stimulation was performed, resulting in a bilateral and symmetric photic driving response. Hyperventilation was also performed, resulting in a minimal buildup of the background rhythm activities without significant slowing seen. At no time during the recording does there appear to be evidence of spike or spike wave discharges or evidence of focal slowing. EKG monitor shows no evidence of cardiac rhythm abnormalities with a heart rate of 60.  Impression: This is a normal EEG recording in the waking state. No evidence of ictal or interictal discharges are seen.

## 2017-01-11 ENCOUNTER — Telehealth: Payer: Self-pay

## 2017-01-11 NOTE — Telephone Encounter (Signed)
LEft vm for son [name] on dpr to call back about his moms eeg results.

## 2017-01-11 NOTE — Telephone Encounter (Signed)
-----   Message from Marvel PlanJindong Xu, MD sent at 01/11/2017  1:48 PM EDT ----- Could you please let the patient know that the EEG test done recently in our office was normal. This is a good news. Just continue her medication at current dose and continue to monitor. Thanks.  Marvel PlanJindong Xu, MD PhD Stroke Neurology 01/11/2017 1:48 PM

## 2017-01-11 NOTE — Telephone Encounter (Signed)
Rn receive call that his moms EEG was normal .Good news per Dr.Xu. Continue treatment plan,and monitor. PTs son verbalized understanding. His mom is taking the 50mg  bid dosage, could not tolerate the 75mg  dosage at this time.

## 2017-01-16 ENCOUNTER — Other Ambulatory Visit: Payer: Self-pay | Admitting: Neurology

## 2017-01-16 DIAGNOSIS — R569 Unspecified convulsions: Secondary | ICD-10-CM

## 2017-01-19 ENCOUNTER — Ambulatory Visit (INDEPENDENT_AMBULATORY_CARE_PROVIDER_SITE_OTHER): Payer: Medicare Other | Admitting: Neurology

## 2017-01-19 ENCOUNTER — Encounter: Payer: Self-pay | Admitting: Neurology

## 2017-01-19 VITALS — BP 140/66 | HR 55 | Ht 62.0 in | Wt 99.2 lb

## 2017-01-19 DIAGNOSIS — I10 Essential (primary) hypertension: Secondary | ICD-10-CM

## 2017-01-19 DIAGNOSIS — R569 Unspecified convulsions: Secondary | ICD-10-CM | POA: Diagnosis not present

## 2017-01-19 MED ORDER — LAMOTRIGINE 25 MG PO TABS: 50.0000 mg | ORAL_TABLET | Freq: Two times a day (BID) | ORAL | 5 refills | Status: DC

## 2017-01-19 NOTE — Patient Instructions (Signed)
-   continue lamictal 50mg  twice a day for seizure control. - follow up with your primary care physician for depression monitoring. - Please maintain seizure precautions. Do not participate in activities where a loss of awareness could hurt you or someone else.  No swimming alone, no tub bathing, no hot tubs, no driving, no operating motorized vehicles(cars, ATVs, motorbikes, etc), lawnmowers or power tools.  No standing at heights, such as rooftops, ladders or stairs. No sliding boards, monkey bars or swings, or climbing trees.  Avoid hot objects such as stoves, heaters, open fires.  Wear a helmet when riding a bicycle, scooter, skateboard, etc. and avoid areas of traffic.  Set water heater to 120 degrees or less. - follow up in 6 months.

## 2017-01-19 NOTE — Progress Notes (Signed)
NEUROLOGY CLINIC NEW PATIENT NOTE  NAME: Cathy Morton DOB: 11-01-1922 REFERRING PHYSICIAN: Geoffry Paradise, MD  I saw Cathy Morton as a new consult in the neurovascular clinic today regarding recent ER visit.  HPI: Cathy Morton is a 83 y.o. female who has a PMhx of HTN, hypothyroidism presented for ED discharge follow up. She presented to the ER on 09/14/16 for two episodes of AMS. Son reported that pt had two episodes in the span of 3 days of being "spaced out". Episodes lasted for ~ 7 minutes, pt "looked out in space" stating she was "completely out of it". He nudged her and tried to bring her to but she was unresponsive. At the time she was getting out of the spell, she had one body jerk and then loudly scream "where am I" then she was back to her baseline. In ER, UA showed no UTI. CT head no acute findings. She was discharged home.  Per son, pt just recently lost her husband a year ago and has not been doing well since. pt repeated states "I'm just confused and I miss him". She had her first episode of staring spells soon after her husband died, she had similar episode before going to her husband funeral. Lasted 4-5 min, and came out of it with a jerk and screaming "where am I".   Since pt husband death, pt seems in depression with memory loss and not feeling well. She had very mild cognitive impairment before this. She was started on aricept and recently increased to 10mg  daily. Pt had a fall ~ 6 months ago that caused a compression fracture of the lumbar vertebrae that has healed appropriately and has not had any recent falls. Her BP 137/68 today, on Hyzaar.    Interval history During the interval time, patient has been doing well. MRI unremarkable except b/l mesial temporal lobe atrophy. EEG showed episodic generalized theta slowing and occasional left temporal sharp wave activity. Put on vimpat, however, she did not tolerating Vimpat at higher dose. Change her to Lamictal with  titration. Initially, at the lower dose, she still had episode of seizures, bridged with low-dose Vimpat and eventually discontinued Vimpat and limited total up to 75 mg twice a day. However, patient cannot tolerating 75 mg twice a day Lamictal, has to decrease Lamictal to 50 mg D. Repeat EEG under this dose was normal. Patient had no more seizures since then. Patient followed with the psychologist twice for depression counseling, now discharged from psychologist service. Following with PCP regularly.   Past Medical History:  Diagnosis Date  . Anxiety   . Dementia   . Hypertension   . Hypothyroidism   . Seizures (HCC)   . Thyroid disease    Past Surgical History:  Procedure Laterality Date  . NO PAST SURGERIES     History reviewed. No pertinent family history. Current Outpatient Prescriptions  Medication Sig Dispense Refill  . acetaminophen (TYLENOL) 325 MG tablet Take 650 mg by mouth every 6 (six) hours as needed.    . donepezil (ARICEPT) 10 MG tablet Take 10 mg by mouth daily.  6  . Ginkgo Biloba 60 MG CAPS Take 60 mg by mouth 2 (two) times daily.    . hydroxypropyl methylcellulose / hypromellose (ISOPTO TEARS / GONIOVISC) 2.5 % ophthalmic solution Place 2 drops into both eyes 3 (three) times daily as needed for dry eyes.    Marland Kitchen lamoTRIgine (LAMICTAL) 25 MG tablet Take 2 tablets (50 mg total) by mouth  2 (two) times daily. 120 tablet 5  . levothyroxine (SYNTHROID, LEVOTHROID) 100 MCG tablet Take 100 mcg by mouth daily before breakfast.    . losartan-hydrochlorothiazide (HYZAAR) 100-12.5 MG tablet Take 1 tablet by mouth daily.    . Multiple Vitamin (MULTIVITAMIN WITH MINERALS) TABS tablet Take 1 tablet by mouth daily.    . Multiple Vitamins-Minerals (PRESERVISION AREDS 2+MULTI VIT PO) Take 1 tablet by mouth every evening.     No current facility-administered medications for this visit.    No Known Allergies Social History   Social History  . Marital status: Married    Spouse name:  N/A  . Number of children: N/A  . Years of education: N/A   Occupational History  . Not on file.   Social History Main Topics  . Smoking status: Never Smoker  . Smokeless tobacco: Never Used  . Alcohol use No  . Drug use: No  . Sexual activity: Not on file   Other Topics Concern  . Not on file   Social History Narrative  . No narrative on file    Review of Systems Full 14 system review of systems performed and notable only for those listed, all others are neg:  Constitutional:  Cardiovascular:  Ear/Nose/Throat:  Hearing loss Skin:  Eyes:  Blurry vision Respiratory:   Gastroitestinal:   Genitourinary:  Hematology/Lymphatic:   Endocrine:  Musculoskeletal:   Allergy/Immunology:   Neurological:  Memory loss Psychiatric: Nervous Sleep:    Physical Exam  Vitals:   01/19/17 1126  BP: 140/66  Pulse: (!) 55    General - Well nourished, well developed, in no apparent distress.  Ophthalmologic - fundi not visualized due to noncooperation.  Cardiovascular - Regular rate and rhythm with no murmur.   Neck - supple, no nuchal rigidity .  Mental Status -  Level of arousal and orientation to place, and person were intact, not orientated to time or age. Language including expression, naming, repetition, comprehension was assessed and found intact.  Cranial Nerves II - XII - II - Visual field intact OU. III, IV, VI - Extraocular movements intact. V - Facial sensation intact bilaterally. VII - Facial movement intact bilaterally. VIII - hard of hearing & vestibular intact bilaterally. X - Palate elevates symmetrically. XI - Chin turning & shoulder shrug intact bilaterally. XII - Tongue protrusion intact.  Motor Strength - The patient's strength was normal in all extremities and pronator drift was absent.  Bulk was normal and fasciculations were absent.   Motor Tone - Muscle tone was assessed at the neck and appendages and was normal.  Reflexes - The patient's  reflexes were normal in all extremities and she had no pathological reflexes.  Sensory - Light touch, temperature/pinprick were assessed and were normal.    Coordination - The patient had normal movements in the hands with no ataxia or dysmetria.  Tremor was absent.  Gait and Station - small stride, stooped posturing.   Imaging  I have personally reviewed the radiological images below and agree with the radiology interpretations.  Ct Head Wo Contrast 09/15/2016 IMPRESSION: 1. No acute intracranial process. 2. Generalized age-related cerebral atrophy with mild chronic small vessel ischemic disease.   Ct Head Wo Contrast 11/21/2016 IMPRESSION: HEAD CT:  No acute intracranial abnormalities.  No skull fracture. CERVICAL CT:  No fracture or acute finding. Electronically Signed   By: Amie Portland M.D.   On: 11/21/2016 22:51   MRI brain w/w/o 1.   Mild generalized cortical atrophy that is  more moderate in the mesial temporal lobes. 2.   T2/FLAIR hyperintense foci in the hemispheres consistent with mild age appropriate chronic microvascular ischemic change. 3.   Mild right mastoid effusion. This could be due to eustachian tube dysfunction. 4.   There are no acute findings. There is a normal enhancement pattern.  EEG 10/12/16 - This is an abnormal EEG recording secondary to episodic generalized theta slowing and occasional left temporal sharp wave activity. This recording suggests a lowered seizure threshold, possibly with a left brain focus. No electrographic seizures were seen.  EEG 01/10/17 - This is a normal EEG recording in the waking state. No evidence of ictal or interictal discharges are seen.  Lab Review Cre 0.88   Assessment:   In summary, Cathy Morton is a 83 y.o. female with PMH of HTN and hypothyroidism followed up for seizure activity. Pt had two staring spells in the last one year after her husband passed away. She did developed depression due to that. For the seizure work  up,  MRI with and without contrast showed b/l mesial temporal lobe atrophy, EEG episodic generalized theta slowing and occasional left temporal sharp wave activity and started on vimpat. However, she did not tolerating Vimpat at higher dose. Change her to Lamictal with titration. Also not tolerating 75 mg twice a day, has to decrease Lamictal to 50 mg bid. Repeat EEG under this dose was normal. Patient seizure controlled now. Patient followed with the psychologist for depression counseling, now discharged from psychologist service. Following with PCP regularly.  Plan: - continue lamictal 50mg  twice a day for seizure control. - follow up with your primary care physician for depression monitoring. - Please maintain seizure precautions. Do not participate in activities where a loss of awareness could hurt you or someone else.  No swimming alone, no tub bathing, no hot tubs, no driving, no operating motorized vehicles(cars, ATVs, motorbikes, etc), lawnmowers or power tools.  No standing at heights, such as rooftops, ladders or stairs. No sliding boards, monkey bars or swings, or climbing trees.  Avoid hot objects such as stoves, heaters, open fires.  Wear a helmet when riding a bicycle, scooter, skateboard, etc. and avoid areas of traffic.  Set water heater to 120 degrees or less. - follow up in 6 months with Eber Jones  I spent more than 25 minutes of face to face time with the patient. Greater than 50% of time was spent in counseling and coordination of care. We discussed seizure medications, seizure precautions, depression monitoring.   No orders of the defined types were placed in this encounter.   Meds ordered this encounter  Medications  . acetaminophen (TYLENOL) 325 MG tablet    Sig: Take 650 mg by mouth every 6 (six) hours as needed.  . lamoTRIgine (LAMICTAL) 25 MG tablet    Sig: Take 2 tablets (50 mg total) by mouth 2 (two) times daily.    Dispense:  120 tablet    Refill:  5    Patient  Instructions  - continue lamictal 50mg  twice a day for seizure control. - follow up with your primary care physician for depression monitoring. - Please maintain seizure precautions. Do not participate in activities where a loss of awareness could hurt you or someone else.  No swimming alone, no tub bathing, no hot tubs, no driving, no operating motorized vehicles(cars, ATVs, motorbikes, etc), lawnmowers or power tools.  No standing at heights, such as rooftops, ladders or stairs. No sliding boards, monkey bars or swings,  or climbing trees.  Avoid hot objects such as stoves, heaters, open fires.  Wear a helmet when riding a bicycle, scooter, skateboard, etc. and avoid areas of traffic.  Set water heater to 120 degrees or less. - follow up in 6 months.    [name], MD PhD [facility] [address], [address] [place], [place] [address] [phone]

## 2017-01-28 ENCOUNTER — Telehealth: Payer: Self-pay

## 2017-01-28 ENCOUNTER — Other Ambulatory Visit: Payer: Self-pay

## 2017-01-28 DIAGNOSIS — R569 Unspecified convulsions: Secondary | ICD-10-CM

## 2017-01-28 MED ORDER — LAMOTRIGINE 100 MG PO TABS: 100.0000 mg | ORAL_TABLET | Freq: Every day | ORAL | 3 refills | Status: DC

## 2017-01-28 MED ORDER — LAMOTRIGINE 25 MG PO TABS: 50.0000 mg | ORAL_TABLET | Freq: Two times a day (BID) | ORAL | 5 refills | Status: DC

## 2017-01-28 NOTE — Telephone Encounter (Signed)
Rn call patients son [name] that the lamictal 100mg  can be cut in half. Pt can take half in the am,and half in the pm. Pts three month supply is just 17.00. Pts son was appreciate,and will call the pharmacy about picking the medication up. Rn stated the directions are on the pill bottle too. Pts son verbalized understanding.

## 2017-01-28 NOTE — Telephone Encounter (Signed)
Rn receive a call from patients son gary about his moms lamictal medication refill. Pt stated at last visit Dr.Xu and him discuss him mom take 100mg  of lamictal daily. The son wanted his mom to take it twice a day not once a day. He would cut the 100mg  pill in half to give her 50mg  in the am, and 50mg  in the pm. Rn stated the new rx that was sent by Dr. Roda ShuttersXu was 2 pills of 25mg  in the am,and 2 pills of 25mg  in the pm. The pt son just wanted his mom take take 2 pills daily instead of 4. Rn stated a call will be made to the pharmacy.Pt son verbalized understanding.

## 2017-01-28 NOTE — Telephone Encounter (Signed)
Rn call [facility] pharmacy and spoke with Heidi. Rn stated DR. Xu wanted pt to take 100mg  daily.The son and doctor agreed the pill can be cut in half so patient can take half pill in the am, and half in pm. Heidi stated the drug guidelines the pill can be cut in half,and a 3 month supply will be 17.00 dollars. Rn sent rx.

## 2017-06-02 ENCOUNTER — Telehealth: Payer: Self-pay | Admitting: Neurology

## 2017-06-02 MED ORDER — LAMOTRIGINE 25 MG PO TABS: 50.0000 mg | ORAL_TABLET | Freq: Every day | ORAL | 3 refills | Status: DC

## 2017-06-02 NOTE — Telephone Encounter (Addendum)
Rn call patients son Cathy Morton about the episodes pt had on 06/01/2017. Cathy Morton stated at 06/01/2017 around 0900pm his mom was upset,shaking and crying. She sat in the chair and had 2 to 3 episodes of her having a blank stare. She was not shaking, or drooling at the mouth or twitching. This lasted from 0900pm to around 0940pm. After each episode she they would ask her if she was fine.Pt would not have any memory of the episode. Pt is currently taking 50mg  lamictal in the am, and 50 lamictal in the pm. The son is cutting the 100mg  dosage in half. He states when he cuts with the pill cutter it breaks off more than usual. He is not sure if the pt is getting 45 or 40 because of the way the pill is cut. He wanted to know if Dr. Roda ShuttersXu would consider going back to lamictal 25mg  where she takes 50mg   in the am, and 50mg   in the pm. He knows pt will be taking 2 whole pills in the am, and pm. The son stated if Dr. Roda ShuttersXu change it, he knows pt will be getting the accurate dosage. Rn stated a message will be sent to Dr Roda ShuttersXu. The son verbalized understanding.

## 2017-06-02 NOTE — Telephone Encounter (Signed)
Talked with son and pt had 3-4 times of staring spells lasting 5 min each for the last 2 days. Among them 3 happened within 40min yesterday. Her symptoms typical for staring spells. Son concerning for pill cutter not doing a good job so that pt not getting enough medication. He denies any recent infection or significant sleep deprivation. Will prescribe 25mg  tab of lamictal to take 2 tabs bid. If continue to have staring spells, will increase night dose to 3 tabs. Of note, she had nausea side effect with 75mg  bid dosing. Pt will see me again next month. Son expressed understanding and appreciation. He will call if pt continues to have seizure like activities.   Marvel PlanJindong Sheri Gatchel, MD PhD Stroke Neurology 06/02/2017 1:57 PM  Meds ordered this encounter  Medications  . lamoTRIgine (LAMICTAL) 25 MG tablet    Sig: Take 2 tablets (50 mg total) by mouth daily.    Dispense:  180 tablet    Refill:  3

## 2017-06-02 NOTE — Telephone Encounter (Signed)
Pt son(on DPR)has called as he was instructed to do by Dr Roda ShuttersXu in the event that pt had more seizures.  Pt son stated within a 40 min time frame pt had about 2-3 and they lasted about 5 mins each.  Pt son states that pt recalls being very tired last night and not sleeping well. Pt also according to son has no memory of what took place last night.  Pt son is aware that Dr Roda ShuttersXu is in hospital this week but has also been made aware message would be sent to his RN.  Please call

## 2017-06-02 NOTE — Addendum Note (Signed)
Addended by: Marvel PlanXU, Philipe Laswell on: 06/02/2017 01:58 PM   Modules accepted: Orders

## 2017-06-02 NOTE — Telephone Encounter (Signed)
Revised. 

## 2017-06-03 ENCOUNTER — Telehealth: Payer: Self-pay | Admitting: Neurology

## 2017-06-03 ENCOUNTER — Other Ambulatory Visit: Payer: Self-pay

## 2017-06-03 MED ORDER — LAMOTRIGINE 25 MG PO TABS: ORAL_TABLET | ORAL | 3 refills | Status: DC

## 2017-06-03 NOTE — Telephone Encounter (Signed)
Order resent to pts pharmacy. Prescription is for lamictal 25 mg tablets, 2 tabs in the am, and 2 tabs in the pm per Dr. Roda ShuttersXu telephone note to pts son.

## 2017-06-03 NOTE — Telephone Encounter (Signed)
Cathy HiddenGary called to inform Cathy Morton that the Cathy Morton is suppose to take two 25MG  tablets daily but the son is saying the pharmacy doesn't have the right dosing and directions. Pts son is wanting Cathy Morton to see if they can fix it today hopefully. Please call to discuss.

## 2017-06-03 NOTE — Telephone Encounter (Signed)
Error

## 2017-06-03 NOTE — Telephone Encounter (Signed)
If son calls back give him the message below:   Rn call pharmacy to make sure they had the right rx. The pharmacy has the new lamictal of 25 mg, 2 tabs in the am, and 2 tabs in the pm. Medication was verified by pharmacy tech. Order was resent today this am.The pharmacy stated they will call the son to state its been change.

## 2017-06-08 ENCOUNTER — Ambulatory Visit (INDEPENDENT_AMBULATORY_CARE_PROVIDER_SITE_OTHER): Payer: Medicare Other

## 2017-06-08 ENCOUNTER — Ambulatory Visit (INDEPENDENT_AMBULATORY_CARE_PROVIDER_SITE_OTHER): Payer: Medicare Other | Admitting: Family

## 2017-06-08 VITALS — Ht 62.0 in | Wt 99.2 lb

## 2017-06-08 DIAGNOSIS — G8929 Other chronic pain: Secondary | ICD-10-CM | POA: Diagnosis not present

## 2017-06-08 DIAGNOSIS — M25512 Pain in left shoulder: Secondary | ICD-10-CM

## 2017-06-08 MED ORDER — METHYLPREDNISOLONE ACETATE 40 MG/ML IJ SUSP
40.0000 mg | INTRAMUSCULAR | Status: AC | PRN
  Administered 2017-06-08: 40 mg via INTRA_ARTICULAR

## 2017-06-08 MED ORDER — LIDOCAINE HCL 1 % IJ SOLN
5.0000 mL | INTRAMUSCULAR | Status: AC | PRN
  Administered 2017-06-08: 5 mL

## 2017-06-08 NOTE — Progress Notes (Signed)
Office Visit Note   Patient: Cathy Morton           Date of Birth: 03/05/1923           MRN: [id number] Visit Date: 06/08/2017              Requested by: Willis Modenarinkard, Sue, NP [address] [place], [place] [id number] PCP: Geoffry ParadiseAronson, Richard, MD  Chief Complaint  Patient presents with  . Left Shoulder - Pain      HPI: The patient is a 84 year old woman seen today for initial evaluation of left shoulder pain. Has been ongoing without relief for last 3 weeks. No improvement with ibuprofen. No injury. No history of similar issues. Pain with above head reaching. No pain with behind back reaching. Complains of some swelling as well. Pain anteriorly, radiates down upper arm. Son accompanies.  Assessment & Plan: Visit Diagnoses:  1. Chronic left shoulder pain     Plan: follow up in office in 4 weeks if no improvement. depomedrol injection left shoulder today.   Follow-Up Instructions: Return in about 4 weeks (around 07/06/2017), or if symptoms worsen or fail to improve.   Left Shoulder Exam   Tenderness  The patient is experiencing tenderness in the biceps tendon.  Range of Motion  The patient has normal left shoulder ROM.  Muscle Strength  The patient has normal left shoulder strength.  Tests  Impingement: positive Drop arm: negative  Other  Erythema: absent Sensation: normal       Patient is alert, oriented, no adenopathy, well-dressed, normal affect, normal respiratory effort.   Imaging: Xr Shoulder Left  Result Date: 06/08/2017 Radiographs of left shoulder show some degenerative changes of AC joint. Mild superior migration of humeral head, no spurring of humeral head.  No images are attached to the encounter.  Labs: Lab Results  Component Value Date   REPTSTATUS 08/30/2009 FINAL 08/28/2009   CULT NO GROWTH 08/28/2009    @LABSALLVALUES (HGBA1)@  Body mass index is 18.14 kg/m.  Orders:  Orders Placed This Encounter  Procedures  . XR Shoulder Left    No orders of the defined types were placed in this encounter.    Procedures: Large Joint Inj: L subacromial bursa on 06/08/2017 1:42 PM Indications: pain Details: 22 G 1.5 in needle Medications: 5 mL lidocaine 1 %; 40 mg methylPREDNISolone acetate 40 MG/ML Consent was given by the patient.      Clinical Data: No additional findings.  ROS:  All other systems negative, except as noted in the HPI. Review of Systems  Constitutional: Negative for chills and fever.  Musculoskeletal: Positive for arthralgias and joint swelling.  Neurological: Negative for weakness.    Objective: Vital Signs: Ht 5\' 2"  (1.575 m)   Wt 99 lb 3.2 oz (45 kg)   BMI 18.14 kg/m   Specialty Comments:  No specialty comments available.  PMFS History: Patient Active Problem List   Diagnosis Date Noted  . Seizures (HCC) 09/29/2016  . Reactive depression 09/29/2016  . Essential hypertension 01/28/2016  . Hypothyroid 01/28/2016  . Anxiety 01/28/2016  . Dementia 01/28/2016  . Compression fracture of first lumbar vertebra (HCC) 01/28/2016   Past Medical History:  Diagnosis Date  . Anxiety   . Dementia   . Hypertension   . Hypothyroidism   . Seizures (HCC)   . Thyroid disease     No family history on file.  Past Surgical History:  Procedure Laterality Date  . NO PAST SURGERIES  Social History   Occupational History  . Not on file  Tobacco Use  . Smoking status: Never Smoker  . Smokeless tobacco: Never Used  Substance and Sexual Activity  . Alcohol use: No  . Drug use: No  . Sexual activity: Not on file       

## 2017-06-22 ENCOUNTER — Encounter: Payer: Self-pay | Admitting: Podiatry

## 2017-06-22 ENCOUNTER — Ambulatory Visit: Payer: Medicare Other | Admitting: Podiatry

## 2017-06-22 VITALS — BP 146/73 | HR 59

## 2017-06-22 DIAGNOSIS — M79675 Pain in left toe(s): Secondary | ICD-10-CM

## 2017-06-22 DIAGNOSIS — M79674 Pain in right toe(s): Secondary | ICD-10-CM

## 2017-06-22 DIAGNOSIS — B351 Tinea unguium: Secondary | ICD-10-CM

## 2017-06-22 NOTE — Progress Notes (Signed)
   Subjective:    Patient ID: Cathy Morton, female    DOB: 01/21/1923, 84 y.o.   MRN: [id number]  HPI patient presents to the office for an evaluation and treatment of her long thick nails.  She is brought to the office by her son and by her  healthcare provider.  She was referred to this office by her medical doctor for nail care.  She has been dealing with thick, swollen feet, especially her left foot.  Her son says the right foot swelling has markedly improved.  He says she is using compression socks to help with the swelling as well as exercise.  There is significant swelling noted on the left foot over the midfoot.  She presents the office today for preventative foot care services.    Review of Systems  All other systems reviewed and are negative.      Objective:   Physical Exam General Appearance  Alert, conversant and in no acute stress.  Vascular  Dorsalis pedis and posterior pulses are palpable  bilaterally.  Capillary return is within normal limits  bilaterally. Temperature is within normal limits  Bilaterally.  Neurologic  Senn-Weinstein monofilament wire test within normal limits  bilaterally. Muscle power within normal limits bilaterally.  Nails Thick disfigured discolored nails with subungual debris bilaterally from hallux to fifth toes bilaterally. No evidence of bacterial infection or drainage bilaterally.  Orthopedic  No limitations of motion of motion feet bilaterally.  No crepitus or effusions noted.  No bony pathology or digital deformities noted. Severe swelling noted over the Liz-Frank Joint left foot.  Skin  normotropic skin with no porokeratosis noted bilaterally.  No signs of infections or ulcers noted.          Assessment & Plan:  Onychomycosis  B/L  IE  Debridement of nails  X 10.  RTC 3 months. I discussed the unilateral swelling with her son and he does not want her to have an x-ray taken because she is under treatment by her medical doctor for this  swelling.  After providing treatment. I told him that if he desires  she can come to the office and an x-ray can be taken at that time.     Helane GuntherGregory Mayer DPM

## 2017-07-21 ENCOUNTER — Ambulatory Visit: Payer: Medicare Other | Admitting: Nurse Practitioner

## 2017-08-31 NOTE — Progress Notes (Signed)
[facility]  PATIENT: Cathy Morton DOB: March 23, 1923   REASON FOR VISIT: follow up for seizure disorder HISTORY FROM: Son and patient    HISTORY OF PRESENT ILLNESS: Dare T [name] a 84 y.o.femalewho has a PMhx of HTN, hypothyroidism presented for ED discharge follow up. She presented to the ER on 09/14/16 for two episodes of AMS. Son reported that pt had two episodes in the span of 3 days of being "spaced out". Episodes lasted for ~ 7 minutes, pt "looked out in space" stating she was "completely out of it". He nudged her and tried to bring her to but she was unresponsive. At the time she was getting out of the spell, she had one body jerk and then loudly scream "where am I" then she was back to her baseline. In ER, UA showed no UTI. CT head no acute findings. She was discharged home.  Per son, pt just recently lost her husband a year ago and has not been doing well since. pt repeated states "I'm just confused and I miss him". She had her first episode of staring spells soon after her husband died, she had similar episode before going to her husband funeral. Lasted 4-5 min, and came out of it with a jerk and screaming "where am I".   Since pt husband death, pt seems in depression with memory loss and not feeling well. She had very mild cognitive impairment before this. She was started on aricept and recently increased to 10mg  daily. Pt had a fall ~ 6 months ago that caused a compression fracture of the lumbar vertebrae that has healed appropriately and has not had any recent falls. Her BP 137/68 today, on Hyzaar.    Interval history 01/19/17 Dr. Roda Shutters During the interval time, patient has been doing well. MRI unremarkable except b/l mesial temporal lobe atrophy. EEG showed episodic generalized theta slowing and occasional left temporal sharp wave activity. Put on vimpat, however, she did not tolerating Vimpat at higher dose. Change her to Lamictal with titration.  Initially, at the lower dose, she still had episode of seizures, bridged with low-dose Vimpat and eventually discontinued Vimpat and limited total up to 75 mg twice a day. However, patient cannot tolerating 75 mg twice a day Lamictal, has to decrease Lamictal to 50 mg D. Repeat EEG under this dose was normal. Patient had no more seizures since then. Patient followed with the psychologist twice for depression counseling, now discharged from psychologist service. Following with PCP regularly. UPDATE 4/4/2019CM [name], 84 year old female returns for follow-up with history of seizure disorder currently on Lamictal tolerating well without recurrent seizure activity since last seen.  She continues to be fairly active continues to live at home alone next door to her son.  She continues to do her cooking laundry housework.  She does not drive any longer.  Otherwise she is independent in activities of daily living.  She denies any balance issues or falls.  She returns for reevaluation  REVIEW OF SYSTEMS: Full 14 system review of systems performed and notable only for those listed, all others are neg:  Constitutional: neg  Cardiovascular: neg Ear/Nose/Throat: Hearing aids Skin: neg Eyes: Glasses Respiratory: neg Gastroitestinal: neg  Hematology/Lymphatic: neg  Endocrine: neg Musculoskeletal:neg Allergy/Immunology: neg Neurological: Seizure disorder Psychiatric: neg Sleep : neg   ALLERGIES: No Known Allergies  HOME MEDICATIONS: Outpatient Medications Prior to Visit  Medication Sig Dispense Refill  . acetaminophen (TYLENOL) 325 MG tablet Take 650 mg by mouth every 6 (  six) hours as needed.    . donepezil (ARICEPT) 10 MG tablet Take 10 mg by mouth daily.  6  . Ginkgo Biloba 60 MG CAPS Take 60 mg by mouth 2 (two) times daily.    . hydroxypropyl methylcellulose / hypromellose (ISOPTO TEARS / GONIOVISC) 2.5 % ophthalmic solution Place 2 drops into both eyes 3 (three) times daily as needed for dry  eyes.    Marland Kitchen lamoTRIgine (LAMICTAL) 25 MG tablet Take two tablets by mouth twice a day 180 tablet 3  . levothyroxine (SYNTHROID, LEVOTHROID) 100 MCG tablet Take 100 mcg by mouth daily before breakfast.    . losartan-hydrochlorothiazide (HYZAAR) 100-12.5 MG tablet Take 1 tablet by mouth daily.    . Multiple Vitamin (MULTIVITAMIN WITH MINERALS) TABS tablet Take 1 tablet by mouth daily.    . Multiple Vitamins-Minerals (PRESERVISION AREDS 2+MULTI VIT PO) Take 1 tablet by mouth every evening.     No facility-administered medications prior to visit.     PAST MEDICAL HISTORY: Past Medical History:  Diagnosis Date  . Anxiety   . Dementia   . Hypertension   . Hypothyroidism   . Seizures (HCC)   . Thyroid disease     PAST SURGICAL HISTORY: Past Surgical History:  Procedure Laterality Date  . NO PAST SURGERIES      FAMILY HISTORY: History reviewed. No pertinent family history.  SOCIAL HISTORY: Social History   Socioeconomic History  . Marital status: Married    Spouse name: Not on file  . Number of children: Not on file  . Years of education: Not on file  . Highest education level: Not on file  Occupational History  . Not on file  Social Needs  . Financial resource strain: Not on file  . Food insecurity:    Worry: Not on file    Inability: Not on file  . Transportation needs:    Medical: Not on file    Non-medical: Not on file  Tobacco Use  . Smoking status: Never Smoker  . Smokeless tobacco: Never Used  Substance and Sexual Activity  . Alcohol use: No  . Drug use: No  . Sexual activity: Not on file  Lifestyle  . Physical activity:    Days per week: Not on file    Minutes per session: Not on file  . Stress: Not on file  Relationships  . Social connections:    Talks on phone: Not on file    Gets together: Not on file    Attends religious service: Not on file    Active member of club or organization: Not on file    Attends meetings of clubs or organizations: Not on  file    Relationship status: Not on file  . Intimate partner violence:    Fear of current or ex partner: Not on file    Emotionally abused: Not on file    Physically abused: Not on file    Forced sexual activity: Not on file  Other Topics Concern  . Not on file  Social History Narrative  . Not on file     PHYSICAL EXAM  Vitals:   09/01/17 1258  BP: 103/60  Pulse: 67  Weight: 103 lb (46.7 kg)  Height: 5\' 2"  (1.575 m)   Body mass index is 18.84 kg/m.  Generalized: Well developed, in no acute distress  Head: normocephalic and atraumatic,. Oropharynx benign  Neck: Supple, no carotid bruits  Cardiac: Regular rate rhythm, no murmur  Musculoskeletal: No deformity  Neurological examination   Mentation: Alert not  oriented to time, or age, history provided by son. attention span and concentration appropriate. Recent and remote memory intact.  Follows all commands speech and language fluent.   Cranial nerve II-XII: Pupils were equal round reactive to light extraocular movements were full, visual field were full on confrontational test. Facial sensation and strength were normal.  Hard of hearing  Uvula tongue midline. head turning and shoulder shrug were normal and symmetric.Tongue protrusion into cheek strength was normal. Motor: normal bulk and tone, full strength in the BUE, BLE,  Sensory: normal and symmetric to light touch,  Coordination: finger-nose-finger, heel-to-shin bilaterally, no dysmetria Reflexes: 1+ upper lower and symmetric plantar responses were flexor bilaterally. Gait and Station: Rising up from seated position without assistance, stooped posture small steppage no difficulty with turns DIAGNOSTIC DATA (LABS, IMAGING, TESTING) - I reviewed patient records, labs, notes, testing and imaging myself where available.  Lab Results  Component Value Date   WBC 5.4 09/14/2016   HGB 12.8 09/14/2016   HCT 37.9 09/14/2016   MCV 91.5 09/14/2016   PLT 177 09/14/2016       Component Value Date/Time   NA 140 09/14/2016 2123   K 3.8 09/14/2016 2123   CL 102 09/14/2016 2123   CO2 29 09/14/2016 2123   GLUCOSE 106 (H) 09/14/2016 2123   BUN 15 09/14/2016 2123   CREATININE 0.88 09/14/2016 2123   CALCIUM 9.5 09/14/2016 2123   PROT 6.0 (L) 09/14/2016 2123   ALBUMIN 3.8 09/14/2016 2123   AST 28 09/14/2016 2123   ALT 22 09/14/2016 2123   ALKPHOS 55 09/14/2016 2123   BILITOT 0.3 09/14/2016 2123   GFRNONAA 56 (L) 09/14/2016 2123   GFRAA >60 09/14/2016 2123       ASSESSMENT AND PLAN  Kandis Mannanlma T Darin is a 84 y.o. female with PMH of HTN and hypothyroidism followed up for seizure activity. Pt had two staring spells in the last one year after her husband passed away. She did developed depression due to that. For the seizure work up,  MRI with and without contrast showed b/l mesial temporal lobe atrophy, EEG episodic generalized theta slowing and occasional left temporal sharp wave activity and started on vimpat. However, she did not tolerating Vimpat at higher dose. Change her to Lamictal with titration. Also not tolerating 75 mg twice a day, has to decrease Lamictal to 50 mg bid. Repeat EEG under this dose was normal. Patient seizure controlled now. Patient followed with the psychologist for depression counseling, now discharged from psychologist service. Following with PCP regularly.  Plan: Continue lamictal 50mg  twice a day for seizure control.  Does not need refills Follow seizure precautions Call for further seizure activity Follow-up in 6-8 months Nilda RiggsNancy Carolyn Mont Jagoda, [facility]ots[facility] HospitalGNP, [facility], APRN  [facility] [address], [address] [place], [place] [address] [phone]

## 2017-09-01 ENCOUNTER — Ambulatory Visit: Payer: Medicare Other | Admitting: Nurse Practitioner

## 2017-09-01 ENCOUNTER — Encounter: Payer: Self-pay | Admitting: Nurse Practitioner

## 2017-09-01 VITALS — BP 103/60 | HR 67 | Ht 62.0 in | Wt 103.0 lb

## 2017-09-01 DIAGNOSIS — R569 Unspecified convulsions: Secondary | ICD-10-CM

## 2017-09-01 NOTE — Patient Instructions (Signed)
Continue lamictal 50mg  twice a day for seizure control.  Does not need refills Follow seizure precautions Call for further seizure activity Follow-up in 6-8 months

## 2017-09-21 ENCOUNTER — Encounter: Payer: Self-pay | Admitting: Podiatry

## 2017-09-21 ENCOUNTER — Ambulatory Visit: Payer: Medicare Other | Admitting: Podiatry

## 2017-09-21 DIAGNOSIS — M79674 Pain in right toe(s): Secondary | ICD-10-CM | POA: Diagnosis not present

## 2017-09-21 DIAGNOSIS — B351 Tinea unguium: Secondary | ICD-10-CM

## 2017-09-21 DIAGNOSIS — M79675 Pain in left toe(s): Secondary | ICD-10-CM

## 2017-09-21 NOTE — Progress Notes (Signed)
Complaint:  Visit Type: Patient returns to my office for continued preventative foot care services. Complaint: Patient states" my nails have grown long and thick and become painful to walk and wear shoes" Patient has been swelling left ankle. She presents to the office today with her sitter.. The patient presents for preventative foot care services. No changes to ROS  Podiatric Exam: Vascular: dorsalis pedis and posterior tibial pulses are palpable bilateral. Capillary return is immediate. Temperature gradient is WNL. Skin turgor WNL  Sensorium: Normal Semmes Weinstein monofilament test. Normal tactile sensation bilaterally. Nail Exam: Pt has thick disfigured discolored nails with subungual debris noted bilateral entire nail hallux through fifth toenails Ulcer Exam: There is no evidence of ulcer or pre-ulcerative changes or infection. Orthopedic Exam: Muscle tone and strength are WNL. No limitations in general ROM. No crepitus or effusions noted. Foot type and digits show no abnormalities. Swelling noted over liz-Frank and left ankle. Skin: No Porokeratosis. No infection or ulcers  Diagnosis:  Onychomycosis, , Pain in right toe, pain in left toes  Treatment & Plan Procedures and Treatment: Consent by patient was obtained for treatment procedures.   Debridement of mycotic and hypertrophic toenails, 1 through 5 bilateral and clearing of subungual debris. No ulceration, no infection noted.  Return Visit-Office Procedure: Patient instructed to return to the office for a follow up visit 3 months for continued evaluation and treatment.    Helane GuntherGregory Ramatoulaye Pack DPM

## 2017-12-21 ENCOUNTER — Ambulatory Visit: Payer: Medicare Other | Admitting: Podiatry

## 2017-12-21 ENCOUNTER — Encounter: Payer: Self-pay | Admitting: Podiatry

## 2017-12-21 DIAGNOSIS — B351 Tinea unguium: Secondary | ICD-10-CM | POA: Diagnosis not present

## 2017-12-21 DIAGNOSIS — M79674 Pain in right toe(s): Secondary | ICD-10-CM

## 2017-12-21 DIAGNOSIS — M79675 Pain in left toe(s): Secondary | ICD-10-CM

## 2017-12-21 NOTE — Progress Notes (Signed)
Complaint:  Visit Type: Patient returns to my office for continued preventative foot care services. Complaint: Patient states" my nails have grown long and thick and become painful to walk and wear shoes" Patient has been swelling left ankle. She presents to the office today with her sitter.. The patient presents for preventative foot care services. No changes to ROS  Podiatric Exam: Vascular: dorsalis pedis and posterior tibial pulses are palpable bilateral. Capillary return is immediate. Temperature gradient is WNL. Skin turgor WNL  Sensorium: Normal Semmes Weinstein monofilament test. Normal tactile sensation bilaterally. Nail Exam: Pt has thick disfigured discolored nails with subungual debris noted bilateral entire nail hallux through fifth toenails Ulcer Exam: There is no evidence of ulcer or pre-ulcerative changes or infection. Orthopedic Exam: Muscle tone and strength are WNL. No limitations in general ROM. No crepitus or effusions noted. Foot type and digits show no abnormalities. Swelling noted over liz-Frank and left ankle. Skin: No Porokeratosis. No infection or ulcers  Diagnosis:  Onychomycosis, , Pain in right toe, pain in left toes  Treatment & Plan Procedures and Treatment: Consent by patient was obtained for treatment procedures.   Debridement of mycotic and hypertrophic toenails, 1 through 5 bilateral and clearing of subungual debris. No ulceration, no infection noted.  Return Visit-Office Procedure: Patient instructed to return to the office for a follow up visit 4 months for continued evaluation and treatment.    Helane GuntherGregory Tallen Schnorr DPM

## 2018-01-13 ENCOUNTER — Emergency Department (HOSPITAL_COMMUNITY): Payer: Medicare Other

## 2018-01-13 ENCOUNTER — Emergency Department (HOSPITAL_COMMUNITY)
Admission: EM | Admit: 2018-01-13 | Discharge: 2018-01-13 | Disposition: A | Discharge status: Home / Self Care | Payer: Medicare Other | Attending: Emergency Medicine | Admitting: Emergency Medicine

## 2018-01-13 ENCOUNTER — Encounter (HOSPITAL_COMMUNITY): Payer: Self-pay

## 2018-01-13 DIAGNOSIS — R0789 Other chest pain: Secondary | ICD-10-CM | POA: Insufficient documentation

## 2018-01-13 DIAGNOSIS — R1011 Right upper quadrant pain: Secondary | ICD-10-CM | POA: Insufficient documentation

## 2018-01-13 DIAGNOSIS — Z79899 Other long term (current) drug therapy: Secondary | ICD-10-CM | POA: Insufficient documentation

## 2018-01-13 DIAGNOSIS — M791 Myalgia, unspecified site: Secondary | ICD-10-CM | POA: Diagnosis not present

## 2018-01-13 DIAGNOSIS — I10 Essential (primary) hypertension: Secondary | ICD-10-CM | POA: Diagnosis not present

## 2018-01-13 DIAGNOSIS — R1084 Generalized abdominal pain: Secondary | ICD-10-CM

## 2018-01-13 DIAGNOSIS — E039 Hypothyroidism, unspecified: Secondary | ICD-10-CM | POA: Insufficient documentation

## 2018-01-13 DIAGNOSIS — F0390 Unspecified dementia without behavioral disturbance: Secondary | ICD-10-CM | POA: Insufficient documentation

## 2018-01-13 LAB — COMPREHENSIVE METABOLIC PANEL
ALK PHOS: 76 U/L (ref 38–126)
ALT: 18 U/L (ref 0–44)
AST: 24 U/L (ref 15–41)
Albumin: 3.8 g/dL (ref 3.5–5.0)
Anion gap: 9 (ref 5–15)
BUN: 14 mg/dL (ref 8–23)
CALCIUM: 9.7 mg/dL (ref 8.9–10.3)
CO2: 31 mmol/L (ref 22–32)
CREATININE: 0.86 mg/dL (ref 0.44–1.00)
Chloride: 102 mmol/L (ref 98–111)
GFR calc non Af Amer: 57 mL/min — ABNORMAL LOW (ref >60)
GLUCOSE: 92 mg/dL (ref 70–99)
Potassium: 3.7 mmol/L (ref 3.5–5.1)
SODIUM: 142 mmol/L (ref 135–145)
Total Bilirubin: 0.9 mg/dL (ref 0.3–1.2)
Total Protein: 6.4 g/dL — ABNORMAL LOW (ref 6.5–8.1)

## 2018-01-13 LAB — URINALYSIS, ROUTINE W REFLEX MICROSCOPIC
BILIRUBIN URINE: NEGATIVE
Glucose, UA: NEGATIVE mg/dL
HGB URINE DIPSTICK: NEGATIVE
Ketones, ur: NEGATIVE mg/dL
Leukocytes, UA: NEGATIVE
Nitrite: NEGATIVE
PH: 7 (ref 5.0–8.0)
Protein, ur: NEGATIVE mg/dL
SPECIFIC GRAVITY, URINE: 1.013 (ref 1.005–1.030)

## 2018-01-13 LAB — I-STAT CG4 LACTIC ACID, ED: Lactic Acid, Venous: 1.38 mmol/L (ref 0.5–1.9)

## 2018-01-13 LAB — CBC WITH DIFFERENTIAL/PLATELET
ABS IMMATURE GRANULOCYTES: 0 10*3/uL (ref 0.0–0.1)
BASOS ABS: 0 10*3/uL (ref 0.0–0.1)
Basophils Relative: 1 %
Eosinophils Absolute: 0 10*3/uL (ref 0.0–0.7)
Eosinophils Relative: 1 %
HCT: 42.1 % (ref 36.0–46.0)
HEMOGLOBIN: 13.7 g/dL (ref 12.0–15.0)
Immature Granulocytes: 1 %
LYMPHS PCT: 16 %
Lymphs Abs: 1.3 10*3/uL (ref 0.7–4.0)
MCH: 30.7 pg (ref 26.0–34.0)
MCHC: 32.5 g/dL (ref 30.0–36.0)
MCV: 94.4 fL (ref 78.0–100.0)
MONO ABS: 0.7 10*3/uL (ref 0.1–1.0)
MONOS PCT: 8 %
NEUTROS ABS: 6 10*3/uL (ref 1.7–7.7)
Neutrophils Relative %: 75 %
Platelets: 147 10*3/uL — ABNORMAL LOW (ref 150–400)
RBC: 4.46 MIL/uL (ref 3.87–5.11)
RDW: 13 % (ref 11.5–15.5)
WBC: 8.1 10*3/uL (ref 4.0–10.5)

## 2018-01-13 LAB — I-STAT TROPONIN, ED
Troponin i, poc: 0 ng/mL (ref 0.00–0.08)
Troponin i, poc: 0.02 ng/mL (ref 0.00–0.08)

## 2018-01-13 LAB — LIPASE, BLOOD: Lipase: 37 U/L (ref 11–51)

## 2018-01-13 MED ORDER — ONDANSETRON HCL 4 MG/2ML IJ SOLN
4.0000 mg | Freq: Once | INTRAMUSCULAR | Status: AC
  Administered 2018-01-13: 4 mg via INTRAVENOUS
  Filled 2018-01-13: qty 2

## 2018-01-13 MED ORDER — FENTANYL CITRATE (PF) 100 MCG/2ML IJ SOLN
50.0000 ug | Freq: Once | INTRAMUSCULAR | Status: AC
  Administered 2018-01-13: 50 ug via INTRAVENOUS
  Filled 2018-01-13: qty 2

## 2018-01-13 MED ORDER — IOHEXOL 300 MG/ML  SOLN
100.0000 mL | Freq: Once | INTRAMUSCULAR | Status: AC | PRN
  Administered 2018-01-13: 100 mL via INTRAVENOUS

## 2018-01-13 MED ORDER — TRAMADOL HCL 50 MG PO TABS: 50.0000 mg | ORAL_TABLET | Freq: Two times a day (BID) | ORAL | 0 refills | Status: DC | PRN

## 2018-01-13 MED ORDER — IOHEXOL 300 MG/ML  SOLN: 100.0000 mL | Freq: Once | INTRAMUSCULAR | Status: DC | PRN

## 2018-01-13 MED ORDER — SODIUM CHLORIDE 0.9 % IV SOLN
INTRAVENOUS | Status: DC
Start: 2018-01-13 — End: 2018-01-14
  Administered 2018-01-13: 15:00:00 via INTRAVENOUS

## 2018-01-13 MED ORDER — HYDROMORPHONE HCL 1 MG/ML IJ SOLN
0.5000 mg | Freq: Once | INTRAMUSCULAR | Status: AC
  Administered 2018-01-13: 0.5 mg via INTRAVENOUS
  Filled 2018-01-13: qty 1

## 2018-01-13 NOTE — ED Provider Notes (Signed)
4:29 PM  Care assumed from Dr. Deretha EmoryZackowski.  At time of transfer care, patient is awaiting diagnostic work-up to look for etiology of her abdominal pain.  Patient is waiting for labs, urine, and CT imaging of the chest abdomen and pelvis.  Anticipate reassessment after work-up.  7:42 PM CT results returned showing no evidence of severe of normalities however there was some mild ductal dilation of her bile duct.  Patient was reassessed by me and continued to have extremely exquisite right upper quadrant and epigastric abdominal tenderness.  Patient was wincing and in severe pain.  Given the continued pain and the possible ductal dilation, right upper quadrant ultrasound will be ordered.  Patient also be given fentanyl to help with the pain.  Anticipate p.o. challenge after work-up.  Patient's ultrasound was unremarkable.    Patient was able to tolerate p.o. and was able to ambulate.  Family feels she is now safe for discharge home.  Patient's pain improved on my reexamination and was not tender.  I suspect patient has a component of constipation as she has not had a bowel movement today.    Family requested pain medicine and say that she has tolerated tramadol several weeks ago.  Patient will be given a prescription for several dose of tramadol to use if the pain is severe and have her follow-up with her PCP in several days.  Patient and family understood plan of care and patient was discharged in good condition with improved abdominal pain.  Clinical Impression: 1. Other chest pain   2. Generalized abdominal pain   3. Myalgia   4. RUQ abdominal pain     Disposition: Discharge  Condition: Good  I have discussed the results, Dx and Tx plan with the pt(& family if present). He/she/they expressed understanding and agree(s) with the plan. Discharge instructions discussed at great length. Strict return precautions discussed and pt &/or family have verbalized understanding of the instructions. No  further questions at time of discharge.    Discharge Medication List as of 01/13/2018 10:51 PM    START taking these medications   Details  traMADol (ULTRAM) 50 MG tablet Take 1 tablet (50 mg total) by mouth every 12 (twelve) hours as needed., Starting Fri 01/13/2018, Print        Follow Up: Geoffry ParadiseAronson, Richard, MD [address] [place] [address] [phone]     [facility] [address] [id number] [place] [id number] [phone]       Tegeler, Canary Brimhristopher J, MD 01/13/18 [phone]

## 2018-01-13 NOTE — ED Notes (Signed)
Assisted patient to the bathroom. Ambulated well with assistance. Pt ate 4 crackers and drank all juice. Reports no nausea or discomfort.

## 2018-01-13 NOTE — Discharge Instructions (Signed)
Your work-up today showed no evidence of acute intra-abdominal or other abnormality.  We did not find evidence of infection.  Please use the pain medicine to help with your symptoms and stay hydrated so that you can have a bowel movement.  Please follow-up with your primary doctor in several days.  If any symptoms change or worsen, please return to the nearest emergency department.

## 2018-01-13 NOTE — ED Notes (Signed)
Patient transported to [facility] 

## 2018-01-13 NOTE — ED Triage Notes (Signed)
Pt presents for evaluation of L sided back and rib cage pain. States she did not fall but that she was trying to get out of the bed. States feels sore in her bra area.

## 2018-01-13 NOTE — ED Provider Notes (Signed)
[facility] Provider Note   CSN: [id number] Arrival date & time: 01/13/18  0945     History   Chief Complaint Chief Complaint  Patient presents with  . Rib Injury    HPI Cathy Morton is a 84 y.o. female.  Patient brought in by family members.  Patient this morning when getting out of bed had a lot of left-sided lateral chest pain.  Patient lives by herself but her son lives next door.  There was some question whether there was a fall but patient denied any fall.  There was no evidence of fall.  Patient stated that it feels sore in her bra area.  When I went to see the patient patient's main complaint was abdominal pain kind of all over and also complaining of pain of all of her extremities.  Patient does have a history of dementia.  No apparent nausea or vomiting.  No evidence of any trauma.  Patient no longer complaining of chest pain.     Past Medical History:  Diagnosis Date  . Anxiety   . Dementia   . Hypertension   . Hypothyroidism   . Seizures (HCC)   . Thyroid disease     Patient Active Problem List   Diagnosis Date Noted  . Seizures (HCC) 09/29/2016  . Reactive depression 09/29/2016  . Essential hypertension 01/28/2016  . Hypothyroid 01/28/2016  . Anxiety 01/28/2016  . Dementia 01/28/2016  . Compression fracture of first lumbar vertebra (HCC) 01/28/2016    Past Surgical History:  Procedure Laterality Date  . NO PAST SURGERIES       OB History   None      Home Medications    Prior to Admission medications   Medication Sig Start Date End Date Taking? Authorizing Provider  donepezil (ARICEPT) 10 MG tablet Take 10 mg by mouth daily. 10/17/16  Yes [provider]  Ginkgo Biloba 60 MG CAPS Take 60 mg by mouth daily.    Yes [provider]  hydroxypropyl methylcellulose / hypromellose (ISOPTO TEARS / GONIOVISC) 2.5 % ophthalmic solution Place 2 drops into both eyes 3 (three) times daily as needed  for dry eyes.   Yes [provider]  ibuprofen (ADVIL,MOTRIN) 200 MG tablet Take 200 mg by mouth every 6 (six) hours as needed for moderate pain.    Yes [provider]  lamoTRIgine (LAMICTAL) 25 MG tablet Take two tablets by mouth twice a day Patient taking differently: Take 50 mg by mouth 2 (two) times daily.  06/03/17  Yes Marvel PlanXu, Jindong, MD  levothyroxine (SYNTHROID, LEVOTHROID) 100 MCG tablet Take 100 mcg by mouth daily before breakfast.   Yes [provider]  losartan-hydrochlorothiazide (HYZAAR) 100-12.5 MG tablet Take 1 tablet by mouth daily. 11/21/15  Yes [provider]  Multiple Vitamins-Minerals (PRESERVISION AREDS 2+MULTI VIT PO) Take 1 tablet by mouth every evening.   Yes [provider]    Family History No family history on file.  Social History Social History   Tobacco Use  . Smoking status: Never Smoker  . Smokeless tobacco: Never Used  Substance Use Topics  . Alcohol use: No  . Drug use: No     Allergies   Patient has no known allergies.   Review of Systems Review of Systems  Unable to perform ROS: Dementia     Physical Exam Updated Vital Signs BP (!) 130/92   Pulse (!) 58   Temp 98.8 F (37.1 C) (Oral)  Resp 13   SpO2 92%   Physical Exam  Constitutional: She appears well-developed and well-nourished. She appears distressed.  HENT:  Head: Normocephalic and atraumatic.  Mouth/Throat: Oropharynx is clear and moist.  Eyes: Pupils are equal, round, and reactive to light. EOM are normal.  Neck: Neck supple.  Cardiovascular: Normal rate and regular rhythm.  Pulmonary/Chest: Effort normal and breath sounds normal. No respiratory distress. She exhibits no tenderness.  Abdominal: Soft. Bowel sounds are normal. She exhibits no distension. There is no tenderness.  Musculoskeletal: Normal range of motion. She exhibits no edema, tenderness or deformity.  Able to passively move all extremities without any increase in  pain.  Neurological: She is alert.  Patient alert verbalizing fine some confusion.  Complaining of pain everywhere.  Able to move all 4 extremities spontaneously.  Skin: Skin is warm. Capillary refill takes less than 2 seconds.  Nursing note and vitals reviewed.    ED Treatments / Results  Labs (all labs ordered are listed, but only abnormal results are displayed) Labs Reviewed  CBC WITH DIFFERENTIAL/PLATELET - Abnormal; Notable for the following components:      Result Value   Platelets 147 (*)    All other components within normal limits  COMPREHENSIVE METABOLIC PANEL - Abnormal; Notable for the following components:   Total Protein 6.4 (*)    GFR calc non Af Amer 57 (*)    All other components within normal limits  LIPASE, BLOOD  URINALYSIS, ROUTINE W REFLEX MICROSCOPIC  I-STAT TROPONIN, ED  I-STAT CG4 LACTIC ACID, ED  I-STAT CG4 LACTIC ACID, ED    EKG None  Radiology Dg Ribs Unilateral W/chest Left  Result Date: 01/13/2018 CLINICAL DATA:  Shortness of breath with mid and lower left chest pain beginning yesterday. No known injury. EXAM: LEFT RIBS AND CHEST - 3+ VIEW COMPARISON:  PA and lateral chest 09/14/2017. FINDINGS: The lungs are clear. Heart size is normal. No pneumothorax or pleural effusion. Aortic atherosclerosis is noted. No acute or focal bony abnormality is identified. IMPRESSION: No acute disease.  Negative for fracture. Atherosclerosis. Electronically Signed   By: Drusilla Kanner M.D.   On: 01/13/2018 10:34    Procedures Procedures (including critical care time)  Medications Ordered in ED Medications  0.9 %  sodium chloride infusion ( Intravenous New Bag/Given 01/13/18 1513)  ondansetron (ZOFRAN) injection 4 mg (4 mg Intravenous Given 01/13/18 1512)  HYDROmorphone (DILAUDID) injection 0.5 mg (0.5 mg Intravenous Given 01/13/18 1511)     Initial Impression / Assessment and Plan / ED Course  I have reviewed the triage vital signs and the nursing  notes.  Pertinent labs & imaging results that were available during my care of the patient were reviewed by me and considered in my medical decision making (see chart for details).     Patient difficult to assess clinically.  Initially pain seemed to be left-sided chest.  Now complaining abdominal pain and started complaining of pain in all extremities.  Based on this I opted to do a broad work-up to include cardiac work-up.  Chest x-ray was negative and included left-sided rib series.  Patient has CT chest abdomen and pelvis pending.  As well as cardiac marker troponin.  First 1 was negative.  Second 1 will be done at 1815.  Patient turned over to evening physician Dr. Bruna Potter.  He will follow-up results.  Disco appropriately.  Final Clinical Impressions(s) / ED Diagnoses   Final diagnoses:  Other chest pain  Generalized abdominal pain  Myalgia  ED Discharge Orders    None       Vanetta MuldersZackowski, Aria Pickrell, MD 01/13/18 1721

## 2018-01-14 NOTE — ED Notes (Signed)
Wasted in sink of fentanyl with sheyla rn

## 2018-02-27 ENCOUNTER — Other Ambulatory Visit: Payer: Self-pay | Admitting: *Deleted

## 2018-02-27 MED ORDER — LAMOTRIGINE 25 MG PO TABS: ORAL_TABLET | ORAL | 3 refills | Status: DC

## 2018-03-03 NOTE — Progress Notes (Signed)
[facility]  PATIENT: Cathy Morton DOB: 1922-09-20   REASON FOR VISIT: follow up for seizure disorder HISTORY FROM: Son and patient    HISTORY OF PRESENT ILLNESS: Cathy Morton a 84 y.o.femalewho has a PMhx of HTN, hypothyroidism presented for ED discharge follow up. She presented to the ER on 09/14/16 for two episodes of AMS. Son reported that pt had two episodes in the span of 3 days of being "spaced out". Episodes lasted for ~ 7 minutes, pt "looked out in space" stating she was "completely out of it". He nudged her and tried to bring her to but she was unresponsive. At the time she was getting out of the spell, she had one body jerk and then loudly scream "where am I" then she was back to her baseline. In ER, UA showed no UTI. CT head no acute findings. She was discharged home.  Per son, pt just recently lost her husband a year ago and has not been doing well since. pt repeated states "I'm just confused and I miss him". She had her first episode of staring spells soon after her husband died, she had similar episode before going to her husband funeral. Lasted 4-5 min, and came out of it with a jerk and screaming "where am I".   Since pt husband death, pt seems in depression with memory loss and not feeling well. She had very mild cognitive impairment before this. She was started on aricept and recently increased to 10mg  daily. Pt had a fall ~ 6 months ago that caused a compression fracture of the lumbar vertebrae that has healed appropriately and has not had any recent falls. Her BP 137/68 today, on Hyzaar.    Interval history 01/19/17 Dr. Roda Shutters During the interval time, patient has been doing well. MRI unremarkable except b/l mesial temporal lobe atrophy. EEG showed episodic generalized theta slowing and occasional left temporal sharp wave activity. Put on vimpat, however, she did not tolerating Vimpat at higher dose. Change her to Lamictal with titration.  Initially, at the lower dose, she still had episode of seizures, bridged with low-dose Vimpat and eventually discontinued Vimpat and limited total up to 75 mg twice a day. However, patient cannot tolerating 75 mg twice a day Lamictal, has to decrease Lamictal to 50 mg D. Repeat EEG under this dose was normal. Patient had no more seizures since then. Patient followed with the psychologist twice for depression counseling, now discharged from psychologist service. Following with PCP regularly. UPDATE 4/4/2019CM [name], 84 year old female returns for follow-up with history of seizure disorder currently on Lamictal tolerating well without recurrent seizure activity since last seen.  She continues to be fairly active continues to live at home alone next door to her son.  She continues to do her cooking laundry housework.  She does not drive any longer.  Otherwise she is independent in activities of daily living.  She denies any balance issues or falls.  She returns for reevaluation UPDATE 03/07/18 CM [name], 84 year old female returns for follow-up with history of seizure disorder.  Last seizure occurred in April 2018.  She is currently on Lamictal 50 mg twice daily without side effects.  She remains fairly active lives in her home home with 24/7 care she continues to do some light cooking.  She no longer drives.  She is independent with dressing feeding bathing.  She had recent complaint of chest pain taken to the ER.  She was eventually given a back brace  to wear for several months for conservative treatment REVIEW OF SYSTEMS: Full 14 system review of systems performed and notable only for those listed, all others are neg:  Constitutional: neg  Cardiovascular: neg Ear/Nose/Throat: Hearing aids Skin: neg Eyes: Glasses Respiratory: neg Gastroitestinal: neg  Hematology/Lymphatic: neg  Endocrine: neg Musculoskeletal:neg Allergy/Immunology: neg Neurological: Seizure disorder, memory loss Psychiatric:  neg Sleep : neg   ALLERGIES: No Known Allergies  HOME MEDICATIONS: Outpatient Medications Prior to Visit  Medication Sig Dispense Refill  . carboxymethylcellulose (REFRESH PLUS) 0.5 % SOLN 1 drop 3 (three) times daily as needed.    . donepezil (ARICEPT) 10 MG tablet Take 10 mg by mouth daily.  6  . ibuprofen (ADVIL,MOTRIN) 200 MG tablet Take 200 mg by mouth every 6 (six) hours as needed for moderate pain.     Marland Kitchen lamoTRIgine (LAMICTAL) 25 MG tablet Take two tablets by mouth twice a day 180 tablet 3  . levothyroxine (SYNTHROID, LEVOTHROID) 100 MCG tablet Take 100 mcg by mouth daily before breakfast.    . losartan-hydrochlorothiazide (HYZAAR) 100-12.5 MG tablet Take 1 tablet by mouth daily.    . Multiple Vitamins-Minerals (PRESERVISION AREDS 2+MULTI VIT PO) Take 1 tablet by mouth every evening.    . Ginkgo Biloba 60 MG CAPS Take 60 mg by mouth daily.     . hydroxypropyl methylcellulose / hypromellose (ISOPTO TEARS / GONIOVISC) 2.5 % ophthalmic solution Place 2 drops into both eyes 3 (three) times daily as needed for dry eyes.    . traMADol (ULTRAM) 50 MG tablet Take 1 tablet (50 mg total) by mouth every 12 (twelve) hours as needed. 10 tablet 0   No facility-administered medications prior to visit.     PAST MEDICAL HISTORY: Past Medical History:  Diagnosis Date  . Anxiety   . Dementia (HCC)   . Hypertension   . Hypothyroidism   . Seizures (HCC)   . Thyroid disease     PAST SURGICAL HISTORY: Past Surgical History:  Procedure Laterality Date  . NO PAST SURGERIES      FAMILY HISTORY: History reviewed. No pertinent family history.  SOCIAL HISTORY: Social History   Socioeconomic History  . Marital status: Married    Spouse name: Not on file  . Number of children: Not on file  . Years of education: Not on file  . Highest education level: Not on file  Occupational History  . Not on file  Social Needs  . Financial resource strain: Not on file  . Food insecurity:    Worry:  Not on file    Inability: Not on file  . Transportation needs:    Medical: Not on file    Non-medical: Not on file  Tobacco Use  . Smoking status: Never Smoker  . Smokeless tobacco: Never Used  Substance and Sexual Activity  . Alcohol use: No  . Drug use: No  . Sexual activity: Not on file  Lifestyle  . Physical activity:    Days per week: Not on file    Minutes per session: Not on file  . Stress: Not on file  Relationships  . Social connections:    Talks on phone: Not on file    Gets together: Not on file    Attends religious service: Not on file    Active member of club or organization: Not on file    Attends meetings of clubs or organizations: Not on file    Relationship status: Not on file  . Intimate partner violence:  Fear of current or ex partner: Not on file    Emotionally abused: Not on file    Physically abused: Not on file    Forced sexual activity: Not on file  Other Topics Concern  . Not on file  Social History Narrative  . Not on file     PHYSICAL EXAM  Vitals:   03/07/18 1313  BP: 138/68  Pulse: (!) 57  Weight: 106 lb 3.2 oz (48.2 kg)  Height: 5\' 2"  (1.575 m)   Body mass index is 19.42 kg/m.  Generalized: Well developed, in no acute distress  Head: normocephalic and atraumatic,. Oropharynx benign  Neck: Supple,  Musculoskeletal: No deformity   Neurological examination   Mentation: Alert not  oriented to time, or age, history provided by son. attention span and concentration appropriate.   Follows all commands speech and language fluent.   Cranial nerve II-XII: Pupils were equal round reactive to light extraocular movements were full, visual field were full on confrontational test. Facial sensation and strength were normal.  Hard of hearing  Uvula tongue midline. head turning and shoulder shrug were normal and symmetric.Tongue protrusion into cheek strength was normal. Motor: normal bulk and tone, full strength in the BUE, BLE,  Sensory:  normal and symmetric to light touch,  Coordination: finger-nose-finger, heel-to-shin bilaterally, no dysmetria Reflexes: 1+ upper lower and symmetric plantar responses were flexor bilaterally. Gait and Station: Rising up from seated position without assistance, stooped posture small steppage no difficulty with turns, device DIAGNOSTIC DATA (LABS, IMAGING, TESTING) - I reviewed patient records, labs, notes, testing and imaging myself where available.  Lab Results  Component Value Date   WBC 8.1 01/13/2018   HGB 13.7 01/13/2018   HCT 42.1 01/13/2018   MCV 94.4 01/13/2018   PLT 147 (L) 01/13/2018      Component Value Date/Time   NA 142 01/13/2018 1510   K 3.7 01/13/2018 1510   CL 102 01/13/2018 1510   CO2 31 01/13/2018 1510   GLUCOSE 92 01/13/2018 1510   BUN 14 01/13/2018 1510   CREATININE 0.86 01/13/2018 1510   CALCIUM 9.7 01/13/2018 1510   PROT 6.4 (L) 01/13/2018 1510   ALBUMIN 3.8 01/13/2018 1510   AST 24 01/13/2018 1510   ALT 18 01/13/2018 1510   ALKPHOS 76 01/13/2018 1510   BILITOT 0.9 01/13/2018 1510   GFRNONAA 57 (L) 01/13/2018 1510   GFRAA >60 01/13/2018 1510       ASSESSMENT AND PLAN  CHARLET HARR is a 84 y.o. female with PMH of HTN and hypothyroidism followed up for seizure activity. Pt had two staring spells in the last one year after her husband passed away. She did developed depression due to that. For the seizure work up,  MRI with and without contrast showed b/l mesial temporal lobe atrophy, EEG episodic generalized theta slowing and occasional left temporal sharp wave activity and started on vimpat. However, she did not tolerating Vimpat at higher dose. Change her to Lamictal with titration. Also not tolerating 75 mg twice a day, has to decrease Lamictal to 50 mg bid. Repeat EEG under this dose was normal. Patient seizure controlled now.  Last event April 2018 .Following with PCP regularly. The patient is a current patient of Dr. Roda Shutters  who no longer works at this  office. . This note is sent to the work in doctor.    Plan: Continue lamictal 50mg  twice a day for seizure control.  Does not need refills Follow seizure precautions Call for  further seizure activity Follow-up in 1 year Nilda Riggs, [facility], [facility], APRN  [facility] [address], [address] [place], [place] [address] [phone]

## 2018-03-07 ENCOUNTER — Encounter: Payer: Self-pay | Admitting: Nurse Practitioner

## 2018-03-07 ENCOUNTER — Ambulatory Visit: Payer: Medicare Other | Admitting: Nurse Practitioner

## 2018-03-07 VITALS — BP 138/68 | HR 57 | Ht 62.0 in | Wt 106.2 lb

## 2018-03-07 DIAGNOSIS — R569 Unspecified convulsions: Secondary | ICD-10-CM

## 2018-03-07 NOTE — Patient Instructions (Signed)
Continue lamictal 50mg  twice a day for seizure control.  Does not need refills Follow seizure precautions Call for further seizure activity Follow-up in 1 year

## 2018-04-06 ENCOUNTER — Other Ambulatory Visit: Payer: Self-pay | Admitting: Neurosurgery

## 2018-04-06 DIAGNOSIS — M8000XA Age-related osteoporosis with current pathological fracture, unspecified site, initial encounter for fracture: Secondary | ICD-10-CM

## 2018-04-13 ENCOUNTER — Ambulatory Visit (HOSPITAL_COMMUNITY)
Admission: RE | Admit: 2018-04-13 | Discharge: 2018-04-13 | Disposition: A | Discharge status: Home / Self Care | Payer: Medicare Other | Source: Ambulatory Visit | Attending: Neurosurgery | Admitting: Neurosurgery

## 2018-04-13 DIAGNOSIS — M8000XA Age-related osteoporosis with current pathological fracture, unspecified site, initial encounter for fracture: Secondary | ICD-10-CM

## 2018-04-13 NOTE — Consult Note (Signed)
Chief Complaint: Patient was seen in consultation today for back pain/possible kyphoplasty  Referring Physician(s): Cram,Gary  Supervising Physician: Julieanne Cottoneveshwar, Sanjeev  [facility]  History of Present Illness: Cathy Morton is a 84 y.o. female with a past medical history significant for anxiety, HTN, hypothyroidism, dementia, seizure disorder, compression fractures of T8, L1 and L4 who presents today after referral from Dr. Wynetta Emeryram for possible T8 kyphoplasty. Patient presented to the ED on 09/14/16 due to AMS which son reported as "spacing out" that lasted >5 minutes before she made a jerking motion and screamed "where am I" before returning to baseline. She was d/ced to home without any changes during that visit. [name] reports that these episodes began shortly after the death of his father and that the patient has not been declining overall since that time. [facility] and has been tried on several different seizure medications.   She presents with her son and full time caregiver today, she is somewhat of a poor historian secondary to dementia however she does endorse back pain which is "quite a lot" although she is unable to assign a number to it. She does have difficulty sleeping and performing ADLs due to pain, she uses a walker at home but has not been walking very much lately due to pain. Her son reports that she was started on Tramadol 50 mg for pain which patient states helps some - caregiver states that this medicine makes her fall asleep. Son is very concerned regarding his mother's back pain and is hopeful that something can be done to allow her to be more comfortable.  Past Medical History:  Diagnosis Date  . Anxiety   . Dementia (HCC)   . Hypertension   . Hypothyroidism   . Seizures (HCC)   . Thyroid disease     Past Surgical History:  Procedure Laterality Date  . NO PAST SURGERIES       Allergies: Patient has no known allergies.  Medications: Prior to Admission medications   Medication Sig Start Date End Date Taking? Authorizing Provider  carboxymethylcellulose (REFRESH PLUS) 0.5 % SOLN 1 drop 3 (three) times daily as needed.    [provider]  donepezil (ARICEPT) 10 MG tablet Take 10 mg by mouth daily. 10/17/16   [provider]  ibuprofen (ADVIL,MOTRIN) 200 MG tablet Take 200 mg by mouth every 6 (six) hours as needed for moderate pain.     [provider]  lamoTRIgine (LAMICTAL) 25 MG tablet Take two tablets by mouth twice a day 02/27/18   Nilda RiggsMartin, Nancy Carolyn, NP  levothyroxine (SYNTHROID, LEVOTHROID) 100 MCG tablet Take 100 mcg by mouth daily before breakfast.    [provider]  losartan-hydrochlorothiazide (HYZAAR) 100-12.5 MG tablet Take 1 tablet by mouth daily. 11/21/15   [provider]  Multiple Vitamins-Minerals (PRESERVISION AREDS 2+MULTI VIT PO) Take 1 tablet by mouth every evening.    [provider]     No family history on file.  Social History   Socioeconomic History  . Marital status: Married    Spouse name: Not on file  . Number of children: Not on file  . Years of education: Not on file  . Highest education level: Not on file  Occupational History  . Not on file  Social Needs  . Financial resource strain: Not on file  . Food insecurity:    Worry: Not on file    Inability: Not  on file  . Transportation needs:    Medical: Not on file    Non-medical: Not on file  Tobacco Use  . Smoking status: Never Smoker  . Smokeless tobacco: Never Used  Substance and Sexual Activity  . Alcohol use: No  . Drug use: No  . Sexual activity: Not on file  Lifestyle  . Physical activity:    Days per week: Not on file    Minutes per session: Not on file  . Stress: Not on file  Relationships  . Social connections:    Talks on phone: Not on file    Gets together: Not on file    Attends religious  service: Not on file    Active member of club or organization: Not on file    Attends meetings of clubs or organizations: Not on file    Relationship status: Not on file  Other Topics Concern  . Not on file  Social History Narrative  . Not on file     Review of Systems: A 12 point ROS discussed and pertinent positives are indicated in the HPI above.  All other systems are negative.  Review of Systems  Constitutional: Positive for activity change and fatigue. Negative for chills and fever.       Limited ROS due to dementia - portions provided by son and caregiver  Respiratory: Negative for cough and shortness of breath.   Cardiovascular: Positive for chest pain (sometimes when back pain is bad).  Gastrointestinal: Negative for diarrhea and vomiting.  Musculoskeletal: Positive for arthralgias and back pain.  Neurological: Positive for seizures. Negative for dizziness and syncope.  Psychiatric/Behavioral: Positive for confusion (at times) and sleep disturbance (difficulty sleeping due to pain).    Vital Signs: There were no vitals taken for this visit.  Physical Exam  Constitutional: No distress.  Frail elderly female in wheelchair with son and caregiver present during exam.   HENT:  Head: Normocephalic.  Pulmonary/Chest: Effort normal.  Abdominal: Soft. She exhibits no distension.  Musculoskeletal:  Pain to palpation of thoracic spine and to a lesser extent lumbar spine. Lumbar back brace in place.  Neurological: She is alert.  Oriented to person and situation - she is aware that she is here to discuss procedure to help with back pain, but is not sure what hospital she is in.  Skin: Skin is warm and dry. She is not diaphoretic.  Psychiatric: She has a normal mood and affect. Her behavior is normal.     Imaging: No results found.  Labs:  CBC: Recent Labs    01/13/18 1510  WBC 8.1  HGB 13.7  HCT 42.1  PLT 147*    COAGS: No results for input(s): INR, APTT in the  last 8760 hours.  BMP: Recent Labs    01/13/18 1510  NA 142  K 3.7  CL 102  CO2 31  GLUCOSE 92  BUN 14  CALCIUM 9.7  CREATININE 0.86  GFRNONAA 57*  GFRAA >60    LIVER FUNCTION TESTS: Recent Labs    01/13/18 1510  BILITOT 0.9  AST 24  ALT 18  ALKPHOS 76  PROT 6.4*  ALBUMIN 3.8    TUMOR MARKERS: No results for input(s): AFPTM, CEA, CA199, CHROMGRNA in the last 8760 hours.  Assessment and Plan:  Patient referred to [facility] from Dr. Wynetta Emery due to intractable thoracic back pain and new T8 compression fracture with ~75% height loss. Patient recently started on Tramadol 50 mg PRN for pain which helps somewhat  but son and caregiver are concerned about the sedative effects of this medication and wish to be able to discontinue this medication if possible.   Dr. Corliss Skains reviewed imaging with patient, son and caregiver today - discussed location of fracture, height loss, kyphosis, previously known L1/L4 compression fractures and indications for vertebroplasty as well as expected outcomes. Patient and son are interested in proceeding with vertebroplasty as soon as possible.  Plan for follow-up vertebroplasty of T8 compression fracture with Dr. Corliss Skains - patient's son requests next Friday if possible, will coordinate with schedulers. Discussed with son that patient will need UA prior to procedure, she can have UA day of procedure however if she is (+) for UTI then the procedure would have to be postponed. Son to arrange for UA 24-48 hours prior to planned procedure.    Dr. Fatima Sanger card with schedulers numbers written on the back and patient/son encouraged to call with any questions or concerns. Schedulers to discuss procedure date/time with patient's son.  All questions answered and concerns addressed. Patient and son convey understanding and agree with plan.  Thank you for this interesting consult.  I greatly enjoyed meeting Cathy Morton and look forward to participating in their  care.  A copy of this report was sent to the requesting provider on this date.  Electronically Signed: Villa Herb, PA-C 04/13/2018, 11:55 AM   I spent a total of  60 Minutes   in face to face in clinical consultation, greater than 50% of which was counseling/coordinating care for vertebroplasty

## 2018-04-14 ENCOUNTER — Other Ambulatory Visit (HOSPITAL_COMMUNITY): Payer: Self-pay | Admitting: Interventional Radiology

## 2018-04-14 DIAGNOSIS — S22060A Wedge compression fracture of T7-T8 vertebra, initial encounter for closed fracture: Secondary | ICD-10-CM

## 2018-04-19 ENCOUNTER — Ambulatory Visit: Payer: Medicare Other | Admitting: Podiatry

## 2018-04-23 ENCOUNTER — Other Ambulatory Visit: Payer: Self-pay | Admitting: Radiology

## 2018-04-24 ENCOUNTER — Other Ambulatory Visit: Payer: Self-pay | Admitting: Physician Assistant

## 2018-04-25 ENCOUNTER — Other Ambulatory Visit: Payer: Self-pay | Admitting: Radiology

## 2018-04-25 ENCOUNTER — Ambulatory Visit (HOSPITAL_COMMUNITY)
Admission: RE | Admit: 2018-04-25 | Discharge: 2018-04-25 | Disposition: A | Discharge status: Home / Self Care | Payer: Medicare Other | Source: Ambulatory Visit | Attending: Interventional Radiology | Admitting: Interventional Radiology

## 2018-04-25 ENCOUNTER — Other Ambulatory Visit (HOSPITAL_COMMUNITY): Payer: Self-pay | Admitting: Interventional Radiology

## 2018-04-25 ENCOUNTER — Encounter (HOSPITAL_COMMUNITY): Payer: Self-pay

## 2018-04-25 DIAGNOSIS — M4854XA Collapsed vertebra, not elsewhere classified, thoracic region, initial encounter for fracture: Secondary | ICD-10-CM | POA: Insufficient documentation

## 2018-04-25 DIAGNOSIS — G40909 Epilepsy, unspecified, not intractable, without status epilepticus: Secondary | ICD-10-CM | POA: Diagnosis not present

## 2018-04-25 DIAGNOSIS — F0390 Unspecified dementia without behavioral disturbance: Secondary | ICD-10-CM | POA: Diagnosis not present

## 2018-04-25 DIAGNOSIS — F419 Anxiety disorder, unspecified: Secondary | ICD-10-CM | POA: Diagnosis not present

## 2018-04-25 DIAGNOSIS — S22060A Wedge compression fracture of T7-T8 vertebra, initial encounter for closed fracture: Secondary | ICD-10-CM

## 2018-04-25 DIAGNOSIS — I10 Essential (primary) hypertension: Secondary | ICD-10-CM | POA: Insufficient documentation

## 2018-04-25 DIAGNOSIS — Z791 Long term (current) use of non-steroidal anti-inflammatories (NSAID): Secondary | ICD-10-CM | POA: Diagnosis not present

## 2018-04-25 DIAGNOSIS — E039 Hypothyroidism, unspecified: Secondary | ICD-10-CM | POA: Diagnosis not present

## 2018-04-25 HISTORY — PX: IR VERTEBROPLASTY CERV/THOR BX INC UNI/BIL INC/INJECT/IMAGING: IMG5515

## 2018-04-25 LAB — BASIC METABOLIC PANEL
ANION GAP: 8 (ref 5–15)
BUN: 8 mg/dL (ref 8–23)
CALCIUM: 9.6 mg/dL (ref 8.9–10.3)
CO2: 30 mmol/L (ref 22–32)
CREATININE: 0.92 mg/dL (ref 0.44–1.00)
Chloride: 96 mmol/L — ABNORMAL LOW (ref 98–111)
GFR calc Af Amer: >60 mL/min (ref >60)
GFR calc non Af Amer: 54 mL/min — ABNORMAL LOW (ref >60)
GLUCOSE: 91 mg/dL (ref 70–99)
Potassium: 3.5 mmol/L (ref 3.5–5.1)
Sodium: 134 mmol/L — ABNORMAL LOW (ref 135–145)

## 2018-04-25 LAB — PROTIME-INR
INR: 1.06
Prothrombin Time: 13.7 seconds (ref 11.4–15.2)

## 2018-04-25 LAB — CBC
HEMATOCRIT: 45.2 % (ref 36.0–46.0)
Hemoglobin: 14.3 g/dL (ref 12.0–15.0)
MCH: 29.4 pg (ref 26.0–34.0)
MCHC: 31.6 g/dL (ref 30.0–36.0)
MCV: 92.8 fL (ref 80.0–100.0)
Platelets: 252 10*3/uL (ref 150–400)
RBC: 4.87 MIL/uL (ref 3.87–5.11)
RDW: 12.7 % (ref 11.5–15.5)
WBC: 5.4 10*3/uL (ref 4.0–10.5)
nRBC: 0 % (ref 0.0–0.2)

## 2018-04-25 LAB — URINALYSIS, COMPLETE (UACMP) WITH MICROSCOPIC
BILIRUBIN URINE: NEGATIVE
GLUCOSE, UA: NEGATIVE mg/dL
HGB URINE DIPSTICK: NEGATIVE
Ketones, ur: NEGATIVE mg/dL
LEUKOCYTES UA: NEGATIVE
NITRITE: NEGATIVE
PH: 7 (ref 5.0–8.0)
PROTEIN: NEGATIVE mg/dL
Specific Gravity, Urine: 1.014 (ref 1.005–1.030)

## 2018-04-25 MED ORDER — BUPIVACAINE HCL (PF) 0.5 % IJ SOLN
INTRAMUSCULAR | Status: AC
  Filled 2018-04-25: qty 30

## 2018-04-25 MED ORDER — CEFAZOLIN SODIUM-DEXTROSE 2-4 GM/100ML-% IV SOLN
2.0000 g | INTRAVENOUS | Status: AC
  Administered 2018-04-25: 2 g via INTRAVENOUS

## 2018-04-25 MED ORDER — TOBRAMYCIN SULFATE 1.2 G IJ SOLR
INTRAMUSCULAR | Status: AC
  Filled 2018-04-25: qty 1.2

## 2018-04-25 MED ORDER — MIDAZOLAM HCL 2 MG/2ML IJ SOLN
INTRAMUSCULAR | Status: AC | PRN
  Administered 2018-04-25: 0.5 mg via INTRAVENOUS
  Administered 2018-04-25: 1 mg via INTRAVENOUS

## 2018-04-25 MED ORDER — CEFAZOLIN SODIUM-DEXTROSE 2-4 GM/100ML-% IV SOLN
INTRAVENOUS | Status: AC
  Filled 2018-04-25: qty 100

## 2018-04-25 MED ORDER — MIDAZOLAM HCL 2 MG/2ML IJ SOLN
INTRAMUSCULAR | Status: AC
  Filled 2018-04-25: qty 2

## 2018-04-25 MED ORDER — SODIUM CHLORIDE 0.9 % IV SOLN: INTRAVENOUS | Status: AC

## 2018-04-25 MED ORDER — IOPAMIDOL (ISOVUE-300) INJECTION 61%
INTRAVENOUS | Status: AC
  Filled 2018-04-25: qty 50

## 2018-04-25 MED ORDER — FENTANYL CITRATE (PF) 100 MCG/2ML IJ SOLN
INTRAMUSCULAR | Status: AC
  Filled 2018-04-25: qty 2

## 2018-04-25 MED ORDER — BUPIVACAINE HCL (PF) 0.5 % IJ SOLN
INTRAMUSCULAR | Status: AC | PRN
  Administered 2018-04-25: 20 mL

## 2018-04-25 MED ORDER — SODIUM CHLORIDE 0.9 % IV SOLN: INTRAVENOUS | Status: DC

## 2018-04-25 MED ORDER — FENTANYL CITRATE (PF) 100 MCG/2ML IJ SOLN
INTRAMUSCULAR | Status: AC | PRN
  Administered 2018-04-25: 25 ug via INTRAVENOUS
  Administered 2018-04-25: 12.5 ug via INTRAVENOUS

## 2018-04-25 NOTE — Discharge Instructions (Addendum)
1. No stooping,bending or lifting more than 10 lbs for 2 weeks. 2.Use walker to ambulate. 3.rtc prn KYPHOPLASTY/VERTEBROPLASTY DISCHARGE INSTRUCTIONS  Medications: (check all that apply)     Resume all home medications as before procedure.                     Continue your pain medications as prescribed as needed.  Over the next 3-5 days, decrease your pain medication as tolerated.  Over the counter medications (i.e. Tylenol, ibuprofen, and aleve) may be substituted once severe/moderate pain symptoms have subsided.   Wound Care: - Bandages may be removed the day following your procedure.  You may get your incision wet once bandages are removed.  Bandaids may be used to cover the incisions until scab formation.  Topical ointments are optional.  - If you develop a fever greater than 101 degrees, have increased skin redness at the incision sites or pus-like oozing from incisions occurring within 1 week of the procedure, contact radiology at [phone] or [phone].  - Ice pack to back for 15-20 minutes 2-3 time per day for first 2-3 days post procedure.  The ice will expedite muscle healing and help with the pain from the incisions.   Activity: - Bedrest today with limited activity for 24 hours post procedure.  - No driving for 48 hours.  - Increase your activity as tolerated after bedrest (with assistance if necessary).  - Refrain from any strenuous activity or heavy lifting (greater than 10 lbs.).   Follow up: -   - If a biopsy was performed at the time of your procedure, your referring physician should receive the results in usually 2-3 days.

## 2018-04-25 NOTE — H&P (Addendum)
Chief Complaint: Patient was seen in consultation today for Thoracic 8 vertebroplasty   Referring Physician(s): Dr Jola BabinskiG Cram  Supervising Physician: Julieanne Cottoneveshwar, Sanjeev  [facility]  History of Present Illness: Kandis Mannanlma T Russom is a 84 y.o. female   Hx Sz disorder Dementia Back pain for weeks No injury known  [facility] Dr[facility] Wynetta Emeryram due to intractable thoracic back pain and new T8 compression fracture with ~75% height loss. (outside imaging 04/04/18-- not in system)  Referred to Dr Corliss Skainseveshwar and seen in consultation 04/13/18  Scheduled now for Thoracic 8 Verterbrolasty   Past Medical History:  Diagnosis Date  . Anxiety   . Dementia (HCC)   . Hypertension   . Hypothyroidism   . Seizures (HCC)   . Thyroid disease     Past Surgical History:  Procedure Laterality Date  . NO PAST SURGERIES      Allergies: Patient has no known allergies.  Medications: Prior to Admission medications   Medication Sig Start Date End Date Taking? Authorizing Provider  carboxymethylcellulose (REFRESH PLUS) 0.5 % SOLN 1 drop 3 (three) times daily as needed.   Yes [provider]  donepezil (ARICEPT) 10 MG tablet Take 10 mg by mouth daily. 10/17/16  Yes [provider]  ibuprofen (ADVIL,MOTRIN) 200 MG tablet Take 200 mg by mouth every 6 (six) hours as needed for moderate pain.    Yes [provider]  lamoTRIgine (LAMICTAL) 25 MG tablet Take two tablets by mouth twice a day Patient taking differently: Take 50 mg by mouth 2 (two) times daily. Take two tablets by mouth twice a day 02/27/18  Yes Nilda RiggsMartin, Nancy Carolyn, NP  levothyroxine (SYNTHROID, LEVOTHROID) 100 MCG tablet Take 100 mcg by mouth daily before breakfast.   Yes [provider]  losartan-hydrochlorothiazide (HYZAAR) 100-12.5 MG tablet Take 1 tablet by mouth daily. 11/21/15  Yes [provider]  Multiple Vitamins-Minerals (PRESERVISION AREDS 2+MULTI VIT PO) Take  1 tablet by mouth every evening.   Yes [provider]  traMADol (ULTRAM) 50 MG tablet Take 50 mg by mouth every 6 (six) hours as needed.   Yes [provider]     History reviewed. No pertinent family history.  Social History   Socioeconomic History  . Marital status: Married    Spouse name: Not on file  . Number of children: Not on file  . Years of education: Not on file  . Highest education level: Not on file  Occupational History  . Not on file  Social Needs  . Financial resource strain: Not on file  . Food insecurity:    Worry: Not on file    Inability: Not on file  . Transportation needs:    Medical: Not on file    Non-medical: Not on file  Tobacco Use  . Smoking status: Never Smoker  . Smokeless tobacco: Never Used  Substance and Sexual Activity  . Alcohol use: No  . Drug use: No  . Sexual activity: Not on file  Lifestyle  . Physical activity:    Days per week: Not on file    Minutes per session: Not on file  . Stress: Not on file  Relationships  . Social connections:    Talks on phone: Not on file    Gets together: Not on file    Attends religious service: Not on file    Active member of club or organization: Not on file    Attends meetings of clubs or  organizations: Not on file    Relationship status: Not on file  Other Topics Concern  . Not on file  Social History Narrative  . Not on file    Review of Systems: A 12 point ROS discussed and pertinent positives are indicated in the HPI above.  All other systems are negative.  Review of Systems  Constitutional: Positive for activity change, appetite change and fatigue. Negative for fever and unexpected weight change.       Thin; frail female Using wheelchair  Respiratory: Negative for cough and shortness of breath.   Gastrointestinal: Negative for abdominal pain.  Musculoskeletal: Positive for back pain and gait problem.  Neurological: Positive for weakness.  Psychiatric/Behavioral:  Positive for confusion and decreased concentration. Negative for behavioral problems.    Vital Signs: BP (!) 165/92   Pulse 66   Temp 97.9 F (36.6 C)   Resp 18   Ht 5\' 5"  (1.651 m)   Wt 106 lb (48.1 kg)   SpO2 96%   BMI 17.64 kg/m   Physical Exam  Cardiovascular: Normal rate and regular rhythm.  Pulmonary/Chest: Effort normal and breath sounds normal.  Abdominal: Soft. Bowel sounds are normal.  Musculoskeletal: Normal range of motion.  Moves all 4s Follows all commands Mid back pain  Neurological: She is alert.  Skin: Skin is warm and dry.  Psychiatric: She has a normal mood and affect. Her behavior is normal.  Not able to tell me dob Consented with Son at bedside  Vitals reviewed.   Imaging: No results found.  Labs:  CBC: Recent Labs    01/13/18 1510  WBC 8.1  HGB 13.7  HCT 42.1  PLT 147*    COAGS: No results for input(s): INR, APTT in the last 8760 hours.  BMP: Recent Labs    01/13/18 1510  NA 142  K 3.7  CL 102  CO2 31  GLUCOSE 92  BUN 14  CALCIUM 9.7  CREATININE 0.86  GFRNONAA 57*  GFRAA >60    LIVER FUNCTION TESTS: Recent Labs    01/13/18 1510  BILITOT 0.9  AST 24  ALT 18  ALKPHOS 76  PROT 6.4*  ALBUMIN 3.8    TUMOR MARKERS: No results for input(s): AFPTM, CEA, CA199, CHROMGRNA in the last 8760 hours.  Assessment and Plan:  Back pain Acute T8 fracture Scheduled for T8 vertebroplasty Risks and benefits of Thoracic 8 vertebroplasty were discussed with the patient including, but not limited to education regarding the natural healing process of compression fractures without intervention, bleeding, infection, cement migration which may cause spinal cord damage, paralysis, pulmonary embolism or even death.  This interventional procedure involves the use of X-rays and because of the nature of the planned procedure, it is possible that we will have prolonged use of X-ray fluoroscopy.  Potential radiation risks to you include (but  are not limited to) the following: - A slightly elevated risk for cancer  several years later in life. This risk is typically less than 0.5% percent. This risk is low in comparison to the normal incidence of human cancer, which is 33% for women and 50% for men according to the [employer] Society. - Radiation induced injury can include skin redness, resembling a rash, tissue breakdown / ulcers and hair loss (which can be temporary or permanent).   The likelihood of either of these occurring depends on the difficulty of the procedure and whether you are sensitive to radiation due to previous procedures, disease, or genetic conditions.  IF your procedure requires a prolonged use of radiation, you will be notified and given written instructions for further action.  It is your responsibility to monitor the irradiated area for the 2 weeks following the procedure and to notify your physician if you are concerned that you have suffered a radiation induced injury.    All of the patient's questions were answered, patient is agreeable to proceed.  Consent signed and in chart.  Thank you for this interesting consult.  I greatly enjoyed meeting CHARLEA NARDO and look forward to participating in their care.  A copy of this report was sent to the requesting provider on this date.  Electronically Signed: Robet Leu, PA-C 04/25/2018, 7:23 AM   I spent a total of  30 Minutes   in face to face in clinical consultation, greater than 50% of which was counseling/coordinating care for T8 VP

## 2018-04-25 NOTE — Procedures (Signed)
S/P T 8 VP 

## 2018-04-26 ENCOUNTER — Encounter (HOSPITAL_COMMUNITY): Payer: Self-pay | Admitting: Interventional Radiology

## 2018-05-01 ENCOUNTER — Emergency Department (HOSPITAL_COMMUNITY): Payer: Medicare Other

## 2018-05-01 ENCOUNTER — Emergency Department (HOSPITAL_COMMUNITY)
Admission: EM | Admit: 2018-05-01 | Discharge: 2018-05-02 | Disposition: A | Discharge status: Home / Self Care | Payer: Medicare Other | Attending: Emergency Medicine | Admitting: Emergency Medicine

## 2018-05-01 ENCOUNTER — Encounter (HOSPITAL_COMMUNITY): Payer: Self-pay | Admitting: *Deleted

## 2018-05-01 ENCOUNTER — Other Ambulatory Visit: Payer: Self-pay

## 2018-05-01 DIAGNOSIS — R569 Unspecified convulsions: Secondary | ICD-10-CM | POA: Diagnosis present

## 2018-05-01 DIAGNOSIS — R531 Weakness: Secondary | ICD-10-CM | POA: Insufficient documentation

## 2018-05-01 DIAGNOSIS — G40A09 Absence epileptic syndrome, not intractable, without status epilepticus: Secondary | ICD-10-CM | POA: Diagnosis not present

## 2018-05-01 DIAGNOSIS — Z79899 Other long term (current) drug therapy: Secondary | ICD-10-CM | POA: Diagnosis not present

## 2018-05-01 DIAGNOSIS — E039 Hypothyroidism, unspecified: Secondary | ICD-10-CM | POA: Insufficient documentation

## 2018-05-01 DIAGNOSIS — F0390 Unspecified dementia without behavioral disturbance: Secondary | ICD-10-CM | POA: Insufficient documentation

## 2018-05-01 DIAGNOSIS — I10 Essential (primary) hypertension: Secondary | ICD-10-CM | POA: Insufficient documentation

## 2018-05-01 DIAGNOSIS — R269 Unspecified abnormalities of gait and mobility: Secondary | ICD-10-CM

## 2018-05-01 LAB — BASIC METABOLIC PANEL
ANION GAP: 6 (ref 5–15)
BUN: 11 mg/dL (ref 8–23)
CALCIUM: 8.9 mg/dL (ref 8.9–10.3)
CO2: 31 mmol/L (ref 22–32)
Chloride: 96 mmol/L — ABNORMAL LOW (ref 98–111)
Creatinine, Ser: 0.92 mg/dL (ref 0.44–1.00)
GFR calc Af Amer: >60 mL/min (ref >60)
GFR, EST NON AFRICAN AMERICAN: 54 mL/min — AB (ref >60)
Glucose, Bld: 98 mg/dL (ref 70–99)
POTASSIUM: 3.7 mmol/L (ref 3.5–5.1)
Sodium: 133 mmol/L — ABNORMAL LOW (ref 135–145)

## 2018-05-01 LAB — CBC WITH DIFFERENTIAL/PLATELET
ABS IMMATURE GRANULOCYTES: 0.01 10*3/uL (ref 0.00–0.07)
BASOS PCT: 1 %
Basophils Absolute: 0.1 10*3/uL (ref 0.0–0.1)
EOS ABS: 0 10*3/uL (ref 0.0–0.5)
Eosinophils Relative: 1 %
HCT: 44.2 % (ref 36.0–46.0)
Hemoglobin: 14.3 g/dL (ref 12.0–15.0)
Immature Granulocytes: 0 %
Lymphocytes Relative: 27 %
Lymphs Abs: 1.3 10*3/uL (ref 0.7–4.0)
MCH: 30.2 pg (ref 26.0–34.0)
MCHC: 32.4 g/dL (ref 30.0–36.0)
MCV: 93.4 fL (ref 80.0–100.0)
MONOS PCT: 10 %
Monocytes Absolute: 0.5 10*3/uL (ref 0.1–1.0)
NEUTROS ABS: 3 10*3/uL (ref 1.7–7.7)
NEUTROS PCT: 61 %
PLATELETS: 182 10*3/uL (ref 150–400)
RBC: 4.73 MIL/uL (ref 3.87–5.11)
RDW: 12.2 % (ref 11.5–15.5)
WBC: 4.8 10*3/uL (ref 4.0–10.5)
nRBC: 0.4 % — ABNORMAL HIGH (ref 0.0–0.2)

## 2018-05-01 LAB — URINALYSIS, ROUTINE W REFLEX MICROSCOPIC
Bilirubin Urine: NEGATIVE
GLUCOSE, UA: NEGATIVE mg/dL
Hgb urine dipstick: NEGATIVE
KETONES UR: NEGATIVE mg/dL
LEUKOCYTES UA: NEGATIVE
Nitrite: NEGATIVE
PROTEIN: NEGATIVE mg/dL
Specific Gravity, Urine: 1.009 (ref 1.005–1.030)
pH: 7 (ref 5.0–8.0)

## 2018-05-01 LAB — TROPONIN I
TROPONIN I: 0.03 ng/mL — AB (ref <0.03)
Troponin I: <0.03 ng/mL (ref <0.03)

## 2018-05-01 MED ORDER — LORAZEPAM 2 MG/ML IJ SOLN
0.5000 mg | Freq: Once | INTRAMUSCULAR | Status: AC
  Administered 2018-05-01: 0.5 mg via INTRAVENOUS
  Filled 2018-05-01: qty 1

## 2018-05-01 MED ORDER — ACETAMINOPHEN 325 MG PO TABS
650.0000 mg | ORAL_TABLET | Freq: Once | ORAL | Status: AC
  Administered 2018-05-01: 650 mg via ORAL
  Filled 2018-05-01: qty 2

## 2018-05-01 MED ORDER — SODIUM CHLORIDE 0.9 % IV BOLUS: 1000.0000 mL | Freq: Once | INTRAVENOUS | Status: DC

## 2018-05-01 MED ORDER — SODIUM CHLORIDE 0.9 % IV BOLUS
500.0000 mL | Freq: Once | INTRAVENOUS | Status: AC
  Administered 2018-05-01: 500 mL via INTRAVENOUS

## 2018-05-01 NOTE — ED Notes (Signed)
[facility] called  

## 2018-05-01 NOTE — Discharge Instructions (Signed)
Stop taking tramadol, this can lower the seizure threshold.  Call your neurologist and let them know that you had a break through seizure and see if they want to change your medications.

## 2018-05-01 NOTE — ED Provider Notes (Signed)
Received the patient in signout from Dr. Ethelda ChickJacubowitz, briefly 84 yo F with worsening back pain and difficulty with ambulation over past week.  Hx of abscence seizures on lamictal.  Had seizure activity typical to her prior lasting for about 40 min.  Plan is to discuss case with IR prior to obtaining plain film of the t spine.   Plain film without acute finding.  Family is ready to take the patient home, but would like a bolus of IV fluids that she has not had anything to eat or drink today.  Given fluids with some mild improvement of her mental state.  Discharge home.  We will have her stop taking tramadol, I recommend calling the neurologist and discussing this breakthrough seizure activity.  She is currently in palliative care with 24-hour assistance.  Family is comfortable taking her home.   Melene PlanFloyd, Annastasia Haskins, DO 05/01/18 1913

## 2018-05-01 NOTE — ED Notes (Signed)
Pt family voices understanding of discharge instructions. Patient transporting home via [facility].

## 2018-05-01 NOTE — ED Triage Notes (Signed)
Per ems family states patient has a history of absent seizures of which lasted 30 mins. Today patient does have a history of same however they have never last that long before, Patient was still staring off, she was given Versed 2.5 per ems, after versed resp rate decreased and patient was bagged for several minutes. Per son , patient had a cement injection into T-8. On tues. Patient started having problems with gait on Friday, she was walking to the side and outwardly rotating her left hip when walking. Presently alert and oriented.

## 2018-05-01 NOTE — ED Notes (Signed)
Family left, requested that we call so that they can open house for EMS.  RN found hearing aids in bed, placed in denture box.  Family took hearing aids home.

## 2018-05-01 NOTE — ED Provider Notes (Signed)
[facility] EMERGENCY DEPARTMENT Provider Note   CSN: [id number] Arrival date & time: 05/01/18  1110     History   Chief Complaint Chief Complaint  Patient presents with  . Seizures   Level 5 caveat dementia history is offered by son who accompanies her HPI Cathy Morton is a 84 y.o. female.  Patient has had progressive generalized weakness for the past 4 days..  She has been walking with a "crooked gait rotating her left hip" she walks today she had an absence seizure lasting 40 minutes per her son who accompanies her EMS treated patient with Versed 2.5 mg intravenously prior to coming here.  Presently she looks "restful" and at her baseline per her son.  HPI  Past Medical History:  Diagnosis Date  . Anxiety   . Dementia (HCC)   . Hypertension   . Hypothyroidism   . Seizures (HCC)   . Thyroid disease     Patient Active Problem List   Diagnosis Date Noted  . Seizures (HCC) 09/29/2016  . Reactive depression 09/29/2016  . Essential hypertension 01/28/2016  . Hypothyroid 01/28/2016  . Anxiety 01/28/2016  . Dementia (HCC) 01/28/2016  . Compression fracture of first lumbar vertebra (HCC) 01/28/2016    Past Surgical History:  Procedure Laterality Date  . IR VERTEBROPLASTY CERV/THOR BX INC UNI/BIL INC/INJECT/IMAGING  04/25/2018  . NO PAST SURGERIES       OB History   None      Home Medications    Prior to Admission medications   Medication Sig Start Date End Date Taking? Authorizing Provider  donepezil (ARICEPT) 10 MG tablet Take 10 mg by mouth daily. 10/17/16  Yes [provider]  lamoTRIgine (LAMICTAL) 25 MG tablet Take two tablets by mouth twice a day Patient taking differently: Take 50 mg by mouth 2 (two) times daily. Take two tablets by mouth twice a day 02/27/18  Yes Nilda Riggs, NP  levothyroxine (SYNTHROID, LEVOTHROID) 100 MCG tablet Take 100 mcg by mouth daily before breakfast.   Yes [provider]    losartan-hydrochlorothiazide (HYZAAR) 100-12.5 MG tablet Take 1 tablet by mouth daily. 11/21/15  Yes [provider]  traMADol (ULTRAM) 50 MG tablet Take 50 mg by mouth every 6 (six) hours as needed for moderate pain.    Yes [provider]  carboxymethylcellulose (REFRESH PLUS) 0.5 % SOLN Place 1 drop into both eyes 3 (three) times daily as needed (dry eyes).     [provider]  ibuprofen (ADVIL,MOTRIN) 200 MG tablet Take 200 mg by mouth every 6 (six) hours as needed for moderate pain.     [provider]  Multiple Vitamins-Minerals (PRESERVISION AREDS 2+MULTI VIT PO) Take 1 tablet by mouth every evening.    [provider]    Family History No family history on file.  Social History Social History   Tobacco Use  . Smoking status: Never Smoker  . Smokeless tobacco: Never Used  Substance Use Topics  . Alcohol use: No  . Drug use: No     Allergies   Patient has no known allergies.   Review of Systems Review of Systems  Unable to perform ROS: Dementia  Musculoskeletal: Positive for gait problem.       Walks with walker  Neurological: Positive for seizures and weakness.       Generalized weakness     Physical Exam Updated Vital Signs BP (!) 185/68   Pulse (!) 51   Temp 98 F (  36.7 C) (Oral)   Resp 12   Ht 5\' 5"  (1.651 m)   Wt 48 kg   SpO2 96%   BMI 17.61 kg/m   Physical Exam  Constitutional:  Frail-appearing  HENT:  Head: Normocephalic and atraumatic.  Eyes: EOM are normal.  Neck: Neck supple. No tracheal deviation present. No thyromegaly present.  Cardiovascular: Regular rhythm.  No murmur heard. Bradycardic  Pulmonary/Chest: Effort normal and breath sounds normal.  Abdominal: Soft. Bowel sounds are normal. She exhibits no distension. There is no tenderness.  Musculoskeletal: Normal range of motion. She exhibits no edema or tenderness.  Thoracic kyphosis.  Moves all 4 extremities without deformity.  She has no  pain on internal or external rotation of either thigh  Neurological: Coordination normal.  Sleepy, easily arouses to verbal stimulus.  Does not follow simple commands, moves all extremities.  Skin: Skin is warm and dry. No rash noted.  Psychiatric: She has a normal mood and affect.  Nursing note and vitals reviewed.    ED Treatments / Results  Labs (all labs ordered are listed, but only abnormal results are displayed) Labs Reviewed  CBC WITH DIFFERENTIAL/PLATELET - Abnormal; Notable for the following components:      Result Value   nRBC 0.4 (*)    All other components within normal limits  BASIC METABOLIC PANEL - Abnormal; Notable for the following components:   Sodium 133 (*)    Chloride 96 (*)    GFR calc non Af Amer 54 (*)    All other components within normal limits  URINALYSIS, ROUTINE W REFLEX MICROSCOPIC  TROPONIN I    EKG EKG Interpretation  Date/Time:  Monday May 01 2018 12:11:10 EST Ventricular Rate:  56 PR Interval:    QRS Duration: 138 QT Interval:  499 QTC Calculation: 482 R Axis:   93 Text Interpretation:  Sinus rhythm RBBB and LPFB ST elevation, consider inferior injury No significant change since last tracing Confirmed by Doug Sou [phone] on 05/01/2018 12:23:31 PM Also confirmed by Doug Sou [phone]), editor Josephine Igo ([id number])  on 05/01/2018 2:15:33 PM   Radiology No results found.  Procedures Procedures (including critical care time)  Medications Ordered in ED Medications - No data to display   Initial Impression / Assessment and Plan / ED Course  I have reviewed the triage vital signs and the nursing notes.  Pertinent labs & imaging results that were available during my care of the patient were reviewed by me and considered in my medical decision making (see chart for details).        Durable Medical Equipment  (From admission, onward)         Start     Ordered   05/01/18 1719  For home use only DME standard manual  wheelchair with seat cushion  Once    Comments:  Patient suffers from generalized weakness which impairs their ability to perform daily activities like ambulating in the home.  A walker will not resolve  issue with performing activities of daily living. A wheelchair will allow patient to safely perform daily activities. Patient can safely propel the wheelchair in the home or has a caregiver who can provide assistance.  Accessories: elevating leg rests (ELRs), wheel locks, extensions and anti-tippers.   05/01/18 1720        Case manager evaluated patient.  Wheelchair ordered for patient.  Gentle intravenous hydration with 500 ml normal saline bolus ordered as son states she has not been eating and drinking as much  as normal.  X-rays viewed by me  pt's son advised to stop tramadol as tramadol can lower seizure threshold.  Tylenol for pain.  Pt signed out to Dr. Adela Lank 530 pm Results for orders placed or performed during the hospital encounter of 05/01/18  CBC with Differential  Result Value Ref Range   WBC 4.8 4.0 - 10.5 K/uL   RBC 4.73 3.87 - 5.11 MIL/uL   Hemoglobin 14.3 12.0 - 15.0 g/dL   HCT 16.1 09.6 - 04.5 %   MCV 93.4 80.0 - 100.0 fL   MCH 30.2 26.0 - 34.0 pg   MCHC 32.4 30.0 - 36.0 g/dL   RDW 40.9 81.1 - 91.4 %   Platelets 182 150 - 400 K/uL   nRBC 0.4 (H) 0.0 - 0.2 %   Neutrophils Relative % 61 %   Neutro Abs 3.0 1.7 - 7.7 K/uL   Lymphocytes Relative 27 %   Lymphs Abs 1.3 0.7 - 4.0 K/uL   Monocytes Relative 10 %   Monocytes Absolute 0.5 0.1 - 1.0 K/uL   Eosinophils Relative 1 %   Eosinophils Absolute 0.0 0.0 - 0.5 K/uL   Basophils Relative 1 %   Basophils Absolute 0.1 0.0 - 0.1 K/uL   Immature Granulocytes 0 %   Abs Immature Granulocytes 0.01 0.00 - 0.07 K/uL  Basic metabolic panel  Result Value Ref Range   Sodium 133 (L) 135 - 145 mmol/L   Potassium 3.7 3.5 - 5.1 mmol/L   Chloride 96 (L) 98 - 111 mmol/L   CO2 31 22 - 32 mmol/L   Glucose, Bld 98 70 - 99 mg/dL   BUN 11  8 - 23 mg/dL   Creatinine, Ser 7.82 0.44 - 1.00 mg/dL   Calcium 8.9 8.9 - 95.6 mg/dL   GFR calc non Af Amer 54 (L) >60 mL/min   GFR calc Af Amer >60 >60 mL/min   Anion gap 6 5 - 15  Urinalysis, Routine w reflex microscopic  Result Value Ref Range   Color, Urine YELLOW YELLOW   APPearance HAZY (A) CLEAR   Specific Gravity, Urine 1.009 1.005 - 1.030   pH 7.0 5.0 - 8.0   Glucose, UA NEGATIVE NEGATIVE mg/dL   Hgb urine dipstick NEGATIVE NEGATIVE   Bilirubin Urine NEGATIVE NEGATIVE   Ketones, ur NEGATIVE NEGATIVE mg/dL   Protein, ur NEGATIVE NEGATIVE mg/dL   Nitrite NEGATIVE NEGATIVE   Leukocytes, UA NEGATIVE NEGATIVE  Troponin I - Add-On to previous collection  Result Value Ref Range   Troponin I 0.03 (HH) <0.03 ng/mL  Troponin I - ONCE - STAT  Result Value Ref Range   Troponin I <0.03 <0.03 ng/mL   Dg Chest 1 View  Result Date: 05/01/2018 CLINICAL DATA:  Abnormal gait. History of seizures. Old hip injury. EXAM: CHEST  1 VIEW COMPARISON:  01/13/2018 FINDINGS: Cardiac silhouette is normal in size. No mediastinal or hilar masses. Lungs are hyperexpanded. There are prominent bronchovascular markings. Mild scarring noted in the apices. These findings are stable. No evidence of pneumonia or pulmonary edema. No pleural effusion or pneumothorax. Skeletal structures are demineralized but grossly intact. There are stable changes from previous left thyroid surgery. IMPRESSION: No acute cardiopulmonary disease. Electronically Signed   By: Amie Portland M.D.   On: 05/01/2018 13:51   Ct Head Wo Contrast  Result Date: 05/01/2018 CLINICAL DATA:  Recent seizure act EXAM: CT HEAD WITHOUT CONTRAST TECHNIQUE: Contiguous axial images were obtained from the base of the skull through the vertex without  intravenous contrast. COMPARISON:  11/21/2016 FINDINGS: Brain: Diffuse atrophic changes and chronic white matter ischemic change are again seen. No findings to suggest acute hemorrhage, acute infarction or  space-occupying mass lesion are noted. Vascular: No hyperdense vessel or unexpected calcification. Skull: Normal. Negative for fracture or focal lesion. Sinuses/Orbits: No acute finding. Other: None. IMPRESSION: Chronic atrophic and ischemic changes without acute intracranial abnormality Electronically Signed   By: Alcide Clever M.D.   On: 05/01/2018 13:20   Ir Vertebroplasty Cerv/thor Bx Inc Uni/bil Inc/inject/imaging  Result Date: 04/26/2018 INDICATION: Severe thoracic back pain secondary to compression fracture at T8. EXAM: VERTEBROPLASTY AT T8 MEDICATIONS: As antibiotic prophylaxis, Ancef 2 g IV was ordered pre-procedure and administered intravenously within 1 hour of incision. ANESTHESIA/SEDATION: Moderate (conscious) sedation was employed during this procedure. A total of Versed 1.5 mg and Fentanyl 37.5 mcg was administered intravenously. Moderate Sedation Time: 21 minutes. The patient's level of consciousness and vital signs were monitored continuously by radiology nursing throughout the procedure under my direct supervision. FLUOROSCOPY TIME:  Fluoroscopy Time: 8 minutes 18 seconds (335 mGy) COMPLICATIONS: None immediate. TECHNIQUE: Informed written consent was obtained from the patient after a thorough discussion of the procedural risks, benefits and alternatives. All questions were addressed. Maximal Sterile Barrier Technique was utilized including caps, mask, sterile gowns, sterile gloves, sterile drape, hand hygiene and skin antiseptic. A timeout was performed prior to the initiation of the procedure. PROCEDURE: The patient was placed prone on the fluoroscopic table. Nasal oxygen was administered. Physiologic monitoring was performed throughout the duration of the procedure. The skin overlying the thoracic region was prepped and draped in the usual sterile fashion. The T8 vertebral body was identified and both pedicles were infiltrated with 0.25% Bupivacaine. This was then followed by the advancement  of a 13-gauge Cook needle through each pedicle into the anterior one-third at T8. A gentle contrast injection demonstrated a trabecular pattern of contrast. At this time, methylmethacrylate mixture was reconstituted. Under biplane intermittent fluoroscopy, the methylmethacrylate was then injected into the T8 vertebral body with filling of the vertebral body. No extravasation was noted into the disk spaces or posteriorly into the spinal canal. No epidural venous contamination was seen. The needles were then removed. Hemostasis was achieved at the skin entry sites. There were no acute complications. Patient tolerated the procedure well. The patient was observed for 3 hours and discharged in good condition. IMPRESSION: 1. Status post vertebral body augmentation for painful compression fracture at T8 using vertebroplasty technique. 2. Prior to procedure, urinalysis revealed slightly cloudy urine with few bacterial cells. Patient was asymptomatic with no fever or chills and with a normal white count. This was discussed with the patient's son. The patient had a urinalysis a few days earlier which was totally negative with clear urine and no bacterial cells. This was discussed with the patient's son. He was informed that this may represent contamination or may represent an early urinary tract infection. Options considered were those of continuing with the vertebral body augmentation given the normal white count, and assuming that today's urinalysis was contaminated versus considering treatment with antibiotics for 2-3 days prior to revisiting vertebral body augmentation. The son expressed his wish to proceed with the vertebral body augmentation given the patient's severe debilitating pain. Electronically Signed   By: Julieanne Cotton M.D.   On: 04/25/2018 11:19   Dg Hip Unilat W Or Wo Pelvis 2-3 Views Left  Result Date: 05/01/2018 CLINICAL DATA:  History of seizures.  Unsteady gait. EXAM: DG HIP (  WITH OR WITHOUT  PELVIS) 2-3V LEFT COMPARISON:  Abdomen and pelvis CT, 01/13/2018. FINDINGS: Compression fracture of L4 which appears new since the prior CT. No other evidence of a fracture. No bone lesion. Skeletal structures are demineralized. Left total hip arthroplasty is well-seated and aligned. Mild hip joint arthropathic changes. SI joints and symphysis pubis are normally spaced and aligned. Skeletal structures are demineralized. IMPRESSION: 1. Fracture of L4, which appears new since the prior CT of the abdomen pelvis dated 01/13/2018. However, there are lumbar spine radiographs dated 04/04/2018, where this fracture is present. 2. No other fractures. 3. Left hip total arthroplasty is well-seated and aligned. Electronically Signed   By: Amie Portlandavid  Ormond M.D.   On: 05/01/2018 13:55  Lab work unremarkable. Final Clinical Impressions(s) / ED Diagnoses  Diagnoses #1 seizure disorder #2 generalized weakness #3 elevated blood pressure Final diagnoses:  None    ED Discharge Orders    None       Doug SouJacubowitz, Ankit Degregorio, MD 05/01/18 1731

## 2018-05-02 NOTE — ED Notes (Signed)
Bed sheets changed, after she attempted to take her clothing off.  Pt resting comfortably

## 2018-05-02 NOTE — ED Notes (Signed)
[facility] arrived, Pt's son called.  Informed him that we found a ring in her bed w/ a large center diamond.  No other rings found even though dirty laundry had been checked.  Ring placed in a denture cup and given to [facility] to transport.  Son understood. Pt IV removed and [facility]

## 2018-05-05 ENCOUNTER — Telehealth: Payer: Self-pay | Admitting: Nurse Practitioner

## 2018-05-05 NOTE — Telephone Encounter (Signed)
Pts son [name]([place]) called stating the pt has had seizures over the past 2 days. Stating that the pt had recent surgery and was given traMADol (ULTRAM) 50 MG tablet knowing that this "takes the seizure meds out" of the pt they have stopped. Please advise

## 2018-05-05 NOTE — Telephone Encounter (Signed)
Rn call patients son Cathy Morton about his mom had a seizure early this week. Pt was on tramadol for a musculoskeletal  Issue. The ED doctor stated when pt took the tramadol it lower her threshold and cause the seizures. The son stated his mom has 24 hour care with CNA. He stated today she is refusing to eat, and is hitting at the CNA. She is also sleeping and at times she is putting her hands in the air but no seizure activity. The CNA stated she will not eat ,and is refusing to.The son stated she has not taken any of her lamictal this am.  He stated pt is sleeping and refusing to eat or take meds.He was calling to let our office know and to look at the Ed notes. Rn stated the ED notes were reviewed.Cathy Morton wanted to know what could he do. Rn advised Cathy Morton to try putting the pills in applesauce or pudding. Pt is taking 50mg  in the am and 50mg  in pm. Rn advised the lamictal does not come in a a injection. ALso in the outpatient setting we cannot do IV lamictal. Cathy Morton will try the applesauce or pudding. Rn advised we are limited in the outpatient setting. The son verbalized understanding, and will call back next week.

## 2018-06-18 ENCOUNTER — Emergency Department (HOSPITAL_COMMUNITY): Payer: Medicare Other

## 2018-06-18 ENCOUNTER — Encounter (HOSPITAL_COMMUNITY): Payer: Self-pay | Admitting: Emergency Medicine

## 2018-06-18 ENCOUNTER — Inpatient Hospital Stay (HOSPITAL_COMMUNITY)
Admission: EM | Admit: 2018-06-18 | Discharge: 2018-06-22 | DRG: 494 | Disposition: A | Discharge status: Skilled Nursing Facility | Payer: Medicare Other | Attending: Internal Medicine | Admitting: Internal Medicine

## 2018-06-18 DIAGNOSIS — S82431A Displaced oblique fracture of shaft of right fibula, initial encounter for closed fracture: Secondary | ICD-10-CM | POA: Diagnosis present

## 2018-06-18 DIAGNOSIS — I10 Essential (primary) hypertension: Secondary | ICD-10-CM | POA: Diagnosis present

## 2018-06-18 DIAGNOSIS — S82251A Displaced comminuted fracture of shaft of right tibia, initial encounter for closed fracture: Secondary | ICD-10-CM

## 2018-06-18 DIAGNOSIS — Z885 Allergy status to narcotic agent status: Secondary | ICD-10-CM

## 2018-06-18 DIAGNOSIS — E039 Hypothyroidism, unspecified: Secondary | ICD-10-CM | POA: Diagnosis present

## 2018-06-18 DIAGNOSIS — G40909 Epilepsy, unspecified, not intractable, without status epilepticus: Secondary | ICD-10-CM | POA: Diagnosis present

## 2018-06-18 DIAGNOSIS — Z79891 Long term (current) use of opiate analgesic: Secondary | ICD-10-CM

## 2018-06-18 DIAGNOSIS — Z79899 Other long term (current) drug therapy: Secondary | ICD-10-CM

## 2018-06-18 DIAGNOSIS — K219 Gastro-esophageal reflux disease without esophagitis: Secondary | ICD-10-CM | POA: Diagnosis present

## 2018-06-18 DIAGNOSIS — S82251B Displaced comminuted fracture of shaft of right tibia, initial encounter for open fracture type I or II: Secondary | ICD-10-CM | POA: Diagnosis not present

## 2018-06-18 DIAGNOSIS — Y92009 Unspecified place in unspecified non-institutional (private) residence as the place of occurrence of the external cause: Secondary | ICD-10-CM

## 2018-06-18 DIAGNOSIS — Z79890 Hormone replacement therapy (postmenopausal): Secondary | ICD-10-CM

## 2018-06-18 DIAGNOSIS — W010XXA Fall on same level from slipping, tripping and stumbling without subsequent striking against object, initial encounter: Secondary | ICD-10-CM | POA: Diagnosis present

## 2018-06-18 DIAGNOSIS — S82201A Unspecified fracture of shaft of right tibia, initial encounter for closed fracture: Secondary | ICD-10-CM | POA: Diagnosis present

## 2018-06-18 DIAGNOSIS — M79661 Pain in right lower leg: Secondary | ICD-10-CM | POA: Diagnosis not present

## 2018-06-18 DIAGNOSIS — Z96642 Presence of left artificial hip joint: Secondary | ICD-10-CM | POA: Diagnosis present

## 2018-06-18 DIAGNOSIS — S82401A Unspecified fracture of shaft of right fibula, initial encounter for closed fracture: Secondary | ICD-10-CM

## 2018-06-18 DIAGNOSIS — R569 Unspecified convulsions: Secondary | ICD-10-CM

## 2018-06-18 DIAGNOSIS — W19XXXA Unspecified fall, initial encounter: Secondary | ICD-10-CM

## 2018-06-18 DIAGNOSIS — Z419 Encounter for procedure for purposes other than remedying health state, unspecified: Secondary | ICD-10-CM

## 2018-06-18 DIAGNOSIS — F0390 Unspecified dementia without behavioral disturbance: Secondary | ICD-10-CM | POA: Diagnosis present

## 2018-06-18 MED ORDER — ACETAMINOPHEN 500 MG PO TABS
1000.0000 mg | ORAL_TABLET | Freq: Once | ORAL | Status: AC
  Administered 2018-06-18: 1000 mg via ORAL
  Filled 2018-06-18: qty 2

## 2018-06-18 NOTE — ED Provider Notes (Signed)
TIME SEEN: 11:25 PM  CHIEF COMPLAINT: Right leg pain  HPI: Patient is a 85 year old female with history of dementia, hypertension, hypothyroidism, seizures who presents to the emergency department with her home health provider via EMS for right leg injury.  This occurred just prior to arrival.  Caregiver provides most of the history.  She states that this evening the patient was sitting in her chair when she began asking to go to bed.  The caregiver asked her to wait for a few more minutes until she can give her her nighttime medications.  Caregiver states the patient became angry and jumped up quickly from a chair.  She states that the patient turned quickly with her right foot planted and her right knee buckled and she fell to the ground.  States she turned and landed on her bottom up against a couch.  She did not hit her head on the ground or lose consciousness.  She is not on blood thinners.  Complaining of right lower extremity pain.  Unable to bear weight after this accident.  Uses a walker at baseline.  Initially called patient's son who is in [place] who told the caregiver not to bring the patient to the emergency department.  When swelling, bruising to the right leg became worse and patient began to complain of more pain and continued to not ambulate, caregiver called 911.  ROS: Level 5 caveat for dementia  PAST MEDICAL HISTORY/PAST SURGICAL HISTORY:  Past Medical History:  Diagnosis Date  . Anxiety   . Dementia (HCC)   . Hypertension   . Hypothyroidism   . Seizures (HCC)   . Thyroid disease     MEDICATIONS:  Prior to Admission medications   Medication Sig Start Date End Date Taking? Authorizing Provider  carboxymethylcellulose (REFRESH PLUS) 0.5 % SOLN Place 1 drop into both eyes 3 (three) times daily as needed (dry eyes).     [provider]  diclofenac sodium (VOLTAREN) 1 % GEL Apply 2 g topically 4 (four) times daily. 03/20/18   [provider]  donepezil  (ARICEPT) 10 MG tablet Take 10 mg by mouth daily. 10/17/16   [provider]  lamoTRIgine (LAMICTAL) 25 MG tablet Take two tablets by mouth twice a day Patient taking differently: Take 50 mg by mouth 2 (two) times daily. Take two tablets by mouth twice a day 02/27/18   Nilda Riggs, NP  levothyroxine (SYNTHROID, LEVOTHROID) 100 MCG tablet Take 100 mcg by mouth daily before breakfast.    [provider]  losartan-hydrochlorothiazide (HYZAAR) 100-12.5 MG tablet Take 1 tablet by mouth daily. 11/21/15   [provider]  Multiple Vitamins-Minerals (PRESERVISION AREDS 2+MULTI VIT PO) Take 1 tablet by mouth every evening.    [provider]  traMADol (ULTRAM) 50 MG tablet Take 50 mg by mouth every 6 (six) hours as needed for moderate pain.     [provider]    ALLERGIES:  No Known Allergies  SOCIAL HISTORY:  Social History   Tobacco Use  . Smoking status: Never Smoker  . Smokeless tobacco: Never Used  Substance Use Topics  . Alcohol use: No    FAMILY HISTORY: No family history on file.  EXAM: BP (!) 149/67 (BP Location: Right Arm)   Pulse 61   Temp 97.7 F (36.5 C) (Oral)   Resp 18   Ht 5\' 5"  (1.651 m)   Wt 48 kg   SpO2 98%   BMI 17.61 kg/m  CONSTITUTIONAL: Alert and oriented to  person but otherwise confused, demented.  Elderly.  In no significant distress. HEAD: Normocephalic; atraumatic EYES: Conjunctivae clear, PERRL, EOMI ENT: normal nose; no rhinorrhea; moist mucous membranes; pharynx without lesions noted; no dental injury; no septal hematoma NECK: Supple, no meningismus, no LAD; no midline spinal tenderness, step-off or deformity; trachea midline CARD: RRR; S1 and S2 appreciated; no murmurs, no clicks, no rubs, no gallops RESP: Normal chest excursion without splinting or tachypnea; breath sounds clear and equal bilaterally; no wheezes, no rhonchi, no rales; no hypoxia or respiratory distress CHEST:  chest wall stable, no  crepitus or ecchymosis or deformity, nontender to palpation; no flail chest ABD/GI: Normal bowel sounds; non-distended; soft, non-tender, no rebound, no guarding; no ecchymosis or other lesions noted PELVIS:  stable, nontender to palpation BACK:  The back appears normal and is non-tender to palpation, there is no CVA tenderness; no midline spinal tenderness, step-off or deformity, significant kyphosis EXT: Patient is tender to palpation over the mid and distal right tibia with associated swelling, ecchymosis.  There is also swelling in the right ankle diffusely in the dorsal foot with tenderness but no obvious bony deformity.  She refuses to move the ankle or toes secondary to pain but has full range of motion of the hip and knee on the right side.  No tenderness over the proximal fibular head on the right side.  Small abrasion to the left elbow without bony tenderness.  2+ DP pulses bilaterally.  Compartments soft. SKIN: Normal color for age and race; warm NEURO: Moves all extremities equally, reports normal sensation in the right lower extremity PSYCH: The patient's mood and manner are appropriate. Grooming and personal hygiene are appropriate.  MEDICAL DECISION MAKING: Patient here with mechanical fall with right lower extremity injury.  Requesting Tylenol for pain.  Will obtain x-rays to evaluate for possible fracture.  No other sign of injury on examination.  Did not hit her head.  Is not on blood thinners.  ED PROGRESS: Patient has an oblique comminuted fracture of the distal right tibia with medial and posterior displacement as well as an oblique minimally displaced fracture of the proximal right fibular shaft with medial and anterior displacement.  Will place in splint and discussed with orthopedics on-call.  Discussed with her son [name] who is patient's POA [phone]) who is currently in [place].  He reports patient has had a left hip replacement 2011 with Dr. Devonne Doughty with [facility]  orthopedics.  Would like to see this orthopedic group again.  12:20 AM  D/w Dr. Linna Caprice with orthopedic surgery.  Recommends long-leg posterior splint.  They will see patient in consult and schedule her for surgery.  Unclear date for surgery at this time.  Recommends hospitalist admission.   12:43 AM Discussed patient's case with hospitalist, Dr. Onalee Hua.  I have recommended admission and patient (and family if present) agree with this plan. Admitting physician will place admission orders.   I reviewed all nursing notes, vitals, pertinent previous records, EKGs, lab and urine results, imaging (as available).   SPLINT APPLICATION Date/Time: 12:12 AM Authorized by: Baxter Hire Mireyah Chervenak Consent: Verbal consent obtained. Risks and benefits: risks, benefits and alternatives were discussed Consent given by: patient Splint applied by: orthopedic technician Location details: right leg Splint type: long leg posterior splint Supplies used: fiberglass Post-procedure: The splinted body part was neurovascularly unchanged following the procedure. Patient tolerance: Patient tolerated the procedure well with no immediate complications.         Dyesha Henault, Layla Maw, DO 06/19/18 [phone]

## 2018-06-18 NOTE — ED Triage Notes (Signed)
Brought from home by ems after having a fall.  Per caregiver patient was going to get in bed when legs slipped pushing her shin into the bed rail.  Has pain and crepitus to right shin with some bruising.  C/o pain on palpation.  Hx of dementia.  No anticoags reported.

## 2018-06-18 NOTE — ED Notes (Signed)
Patient transported to X-ray 

## 2018-06-19 ENCOUNTER — Observation Stay (HOSPITAL_COMMUNITY): Payer: Medicare Other | Admitting: Certified Registered Nurse Anesthetist

## 2018-06-19 ENCOUNTER — Observation Stay (HOSPITAL_COMMUNITY): Payer: Medicare Other

## 2018-06-19 ENCOUNTER — Other Ambulatory Visit: Payer: Self-pay

## 2018-06-19 ENCOUNTER — Inpatient Hospital Stay (HOSPITAL_COMMUNITY): Payer: Medicare Other

## 2018-06-19 ENCOUNTER — Encounter (HOSPITAL_COMMUNITY)
Admission: EM | Disposition: A | Discharge status: Skilled Nursing Facility | Payer: Self-pay | Source: Home / Self Care | Attending: Internal Medicine

## 2018-06-19 ENCOUNTER — Encounter (HOSPITAL_COMMUNITY): Payer: Self-pay | Admitting: Certified Registered Nurse Anesthetist

## 2018-06-19 DIAGNOSIS — W010XXA Fall on same level from slipping, tripping and stumbling without subsequent striking against object, initial encounter: Secondary | ICD-10-CM | POA: Diagnosis present

## 2018-06-19 DIAGNOSIS — S82251A Displaced comminuted fracture of shaft of right tibia, initial encounter for closed fracture: Secondary | ICD-10-CM

## 2018-06-19 DIAGNOSIS — Z885 Allergy status to narcotic agent status: Secondary | ICD-10-CM | POA: Diagnosis not present

## 2018-06-19 DIAGNOSIS — S82251B Displaced comminuted fracture of shaft of right tibia, initial encounter for open fracture type I or II: Secondary | ICD-10-CM | POA: Diagnosis present

## 2018-06-19 DIAGNOSIS — Z79890 Hormone replacement therapy (postmenopausal): Secondary | ICD-10-CM | POA: Diagnosis not present

## 2018-06-19 DIAGNOSIS — S82401A Unspecified fracture of shaft of right fibula, initial encounter for closed fracture: Secondary | ICD-10-CM | POA: Diagnosis not present

## 2018-06-19 DIAGNOSIS — Z79891 Long term (current) use of opiate analgesic: Secondary | ICD-10-CM | POA: Diagnosis not present

## 2018-06-19 DIAGNOSIS — S82431A Displaced oblique fracture of shaft of right fibula, initial encounter for closed fracture: Secondary | ICD-10-CM | POA: Diagnosis present

## 2018-06-19 DIAGNOSIS — M79661 Pain in right lower leg: Secondary | ICD-10-CM | POA: Diagnosis present

## 2018-06-19 DIAGNOSIS — E039 Hypothyroidism, unspecified: Secondary | ICD-10-CM | POA: Diagnosis present

## 2018-06-19 DIAGNOSIS — I10 Essential (primary) hypertension: Secondary | ICD-10-CM | POA: Diagnosis present

## 2018-06-19 DIAGNOSIS — Z79899 Other long term (current) drug therapy: Secondary | ICD-10-CM | POA: Diagnosis not present

## 2018-06-19 DIAGNOSIS — G40909 Epilepsy, unspecified, not intractable, without status epilepticus: Secondary | ICD-10-CM | POA: Diagnosis present

## 2018-06-19 DIAGNOSIS — Y92009 Unspecified place in unspecified non-institutional (private) residence as the place of occurrence of the external cause: Secondary | ICD-10-CM | POA: Diagnosis not present

## 2018-06-19 DIAGNOSIS — F0390 Unspecified dementia without behavioral disturbance: Secondary | ICD-10-CM | POA: Diagnosis present

## 2018-06-19 DIAGNOSIS — S82201A Unspecified fracture of shaft of right tibia, initial encounter for closed fracture: Secondary | ICD-10-CM | POA: Diagnosis present

## 2018-06-19 DIAGNOSIS — Z96642 Presence of left artificial hip joint: Secondary | ICD-10-CM | POA: Diagnosis present

## 2018-06-19 DIAGNOSIS — S82209A Unspecified fracture of shaft of unspecified tibia, initial encounter for closed fracture: Secondary | ICD-10-CM | POA: Insufficient documentation

## 2018-06-19 DIAGNOSIS — K219 Gastro-esophageal reflux disease without esophagitis: Secondary | ICD-10-CM | POA: Diagnosis present

## 2018-06-19 HISTORY — PX: TIBIA IM NAIL INSERTION: SHX2516

## 2018-06-19 LAB — BASIC METABOLIC PANEL
Anion gap: 12 (ref 5–15)
Anion gap: 10 (ref 5–15)
BUN: 28 mg/dL — ABNORMAL HIGH (ref 8–23)
BUN: 25 mg/dL — ABNORMAL HIGH (ref 8–23)
CALCIUM: 9.1 mg/dL (ref 8.9–10.3)
CO2: 27 mmol/L (ref 22–32)
CO2: 30 mmol/L (ref 22–32)
Calcium: 9.2 mg/dL (ref 8.9–10.3)
Chloride: 100 mmol/L (ref 98–111)
Chloride: 100 mmol/L (ref 98–111)
Creatinine, Ser: 1 mg/dL (ref 0.44–1.00)
Creatinine, Ser: 0.9 mg/dL (ref 0.44–1.00)
GFR calc Af Amer: >60 mL/min (ref >60)
GFR calc non Af Amer: 49 mL/min — ABNORMAL LOW (ref >60)
GFR, EST AFRICAN AMERICAN: 56 mL/min — AB (ref >60)
GFR, EST NON AFRICAN AMERICAN: 55 mL/min — AB (ref >60)
Glucose, Bld: 125 mg/dL — ABNORMAL HIGH (ref 70–99)
Glucose, Bld: 117 mg/dL — ABNORMAL HIGH (ref 70–99)
POTASSIUM: 3.9 mmol/L (ref 3.5–5.1)
Potassium: 3.7 mmol/L (ref 3.5–5.1)
Sodium: 139 mmol/L (ref 135–145)
Sodium: 140 mmol/L (ref 135–145)

## 2018-06-19 LAB — CBC WITH DIFFERENTIAL/PLATELET
ABS IMMATURE GRANULOCYTES: 0.03 10*3/uL (ref 0.00–0.07)
BASOS ABS: 0 10*3/uL (ref 0.0–0.1)
Basophils Relative: 1 %
Eosinophils Absolute: 0 10*3/uL (ref 0.0–0.5)
Eosinophils Relative: 0 %
HCT: 39.3 % (ref 36.0–46.0)
Hemoglobin: 12.4 g/dL (ref 12.0–15.0)
Immature Granulocytes: 0 %
Lymphocytes Relative: 15 %
Lymphs Abs: 1 10*3/uL (ref 0.7–4.0)
MCH: 30.1 pg (ref 26.0–34.0)
MCHC: 31.6 g/dL (ref 30.0–36.0)
MCV: 95.4 fL (ref 80.0–100.0)
Monocytes Absolute: 0.5 10*3/uL (ref 0.1–1.0)
Monocytes Relative: 8 %
NRBC: 0 % (ref 0.0–0.2)
Neutro Abs: 5.2 10*3/uL (ref 1.7–7.7)
Neutrophils Relative %: 76 %
Platelets: 238 10*3/uL (ref 150–400)
RBC: 4.12 MIL/uL (ref 3.87–5.11)
RDW: 13.1 % (ref 11.5–15.5)
WBC: 6.8 10*3/uL (ref 4.0–10.5)

## 2018-06-19 LAB — CBC
HCT: 37.5 % (ref 36.0–46.0)
HCT: 36.9 % (ref 36.0–46.0)
HEMOGLOBIN: 12.1 g/dL (ref 12.0–15.0)
Hemoglobin: 12.1 g/dL (ref 12.0–15.0)
MCH: 30.4 pg (ref 26.0–34.0)
MCH: 30.9 pg (ref 26.0–34.0)
MCHC: 32.3 g/dL (ref 30.0–36.0)
MCHC: 32.8 g/dL (ref 30.0–36.0)
MCV: 94.2 fL (ref 80.0–100.0)
MCV: 94.1 fL (ref 80.0–100.0)
Platelets: 242 10*3/uL (ref 150–400)
Platelets: 227 10*3/uL (ref 150–400)
RBC: 3.98 MIL/uL (ref 3.87–5.11)
RBC: 3.92 MIL/uL (ref 3.87–5.11)
RDW: 13.1 % (ref 11.5–15.5)
RDW: 13 % (ref 11.5–15.5)
WBC: 6.1 10*3/uL (ref 4.0–10.5)
WBC: 6.9 10*3/uL (ref 4.0–10.5)
nRBC: 0 % (ref 0.0–0.2)
nRBC: 0 % (ref 0.0–0.2)

## 2018-06-19 LAB — CREATININE, SERUM
Creatinine, Ser: 0.81 mg/dL (ref 0.44–1.00)
GFR calc non Af Amer: >60 mL/min (ref >60)

## 2018-06-19 LAB — SURGICAL PCR SCREEN
MRSA, PCR: NEGATIVE
Staphylococcus aureus: NEGATIVE

## 2018-06-19 SURGERY — INSERTION, INTRAMEDULLARY ROD, TIBIA
Anesthesia: General | Site: Leg Lower | Laterality: Right

## 2018-06-19 MED ORDER — ONDANSETRON HCL 4 MG PO TABS: 4.0000 mg | ORAL_TABLET | Freq: Four times a day (QID) | ORAL | Status: DC | PRN

## 2018-06-19 MED ORDER — 0.9 % SODIUM CHLORIDE (POUR BTL) OPTIME
TOPICAL | Status: DC | PRN
  Administered 2018-06-19: 1000 mL

## 2018-06-19 MED ORDER — HYDROCHLOROTHIAZIDE 12.5 MG PO CAPS
12.5000 mg | ORAL_CAPSULE | Freq: Every day | ORAL | Status: DC
  Administered 2018-06-19 – 2018-06-22 (×4): 12.5 mg via ORAL
  Filled 2018-06-19 (×4): qty 1

## 2018-06-19 MED ORDER — FENTANYL CITRATE (PF) 100 MCG/2ML IJ SOLN
INTRAMUSCULAR | Status: DC | PRN
  Administered 2018-06-19 (×5): 10 ug via INTRAVENOUS

## 2018-06-19 MED ORDER — ONDANSETRON HCL 4 MG/2ML IJ SOLN
INTRAMUSCULAR | Status: AC
  Filled 2018-06-19: qty 2

## 2018-06-19 MED ORDER — LEVOTHYROXINE SODIUM 100 MCG PO TABS
100.0000 ug | ORAL_TABLET | Freq: Every day | ORAL | Status: DC
  Administered 2018-06-20 – 2018-06-22 (×3): 100 ug via ORAL
  Filled 2018-06-19 (×3): qty 1

## 2018-06-19 MED ORDER — LAMOTRIGINE 100 MG PO TABS
50.0000 mg | ORAL_TABLET | Freq: Two times a day (BID) | ORAL | Status: DC
  Administered 2018-06-19 – 2018-06-22 (×7): 50 mg via ORAL
  Filled 2018-06-19 (×8): qty 1

## 2018-06-19 MED ORDER — CHLORHEXIDINE GLUCONATE 4 % EX LIQD
60.0000 mL | Freq: Once | CUTANEOUS | Status: DC
  Administered 2018-06-19: 4 via TOPICAL

## 2018-06-19 MED ORDER — CEFAZOLIN SODIUM-DEXTROSE 2-4 GM/100ML-% IV SOLN
2.0000 g | Freq: Four times a day (QID) | INTRAVENOUS | Status: AC
  Administered 2018-06-19 – 2018-06-20 (×2): 2 g via INTRAVENOUS
  Filled 2018-06-19 (×3): qty 100

## 2018-06-19 MED ORDER — METHOCARBAMOL 500 MG PO TABS: 500.0000 mg | ORAL_TABLET | Freq: Four times a day (QID) | ORAL | Status: DC | PRN

## 2018-06-19 MED ORDER — LIDOCAINE 2% (20 MG/ML) 5 ML SYRINGE
INTRAMUSCULAR | Status: DC | PRN
  Administered 2018-06-19: 50 mg via INTRAVENOUS

## 2018-06-19 MED ORDER — DEXAMETHASONE SODIUM PHOSPHATE 10 MG/ML IJ SOLN
INTRAMUSCULAR | Status: AC
  Filled 2018-06-19: qty 1

## 2018-06-19 MED ORDER — DOCUSATE SODIUM 100 MG PO CAPS
100.0000 mg | ORAL_CAPSULE | Freq: Two times a day (BID) | ORAL | Status: DC
  Administered 2018-06-19 – 2018-06-22 (×6): 100 mg via ORAL
  Filled 2018-06-19 (×7): qty 1

## 2018-06-19 MED ORDER — PROPOFOL 10 MG/ML IV BOLUS
INTRAVENOUS | Status: DC | PRN
  Administered 2018-06-19: 50 mg via INTRAVENOUS

## 2018-06-19 MED ORDER — MUPIROCIN 2 % EX OINT: 1.0000 "application " | TOPICAL_OINTMENT | Freq: Two times a day (BID) | CUTANEOUS | Status: DC

## 2018-06-19 MED ORDER — FENTANYL CITRATE (PF) 100 MCG/2ML IJ SOLN: 25.0000 ug | INTRAMUSCULAR | Status: DC | PRN

## 2018-06-19 MED ORDER — ONDANSETRON HCL 4 MG/2ML IJ SOLN: 4.0000 mg | Freq: Four times a day (QID) | INTRAMUSCULAR | Status: DC | PRN

## 2018-06-19 MED ORDER — PHENYLEPHRINE 40 MCG/ML (10ML) SYRINGE FOR IV PUSH (FOR BLOOD PRESSURE SUPPORT)
PREFILLED_SYRINGE | INTRAVENOUS | Status: DC | PRN
  Administered 2018-06-19 (×3): 120 ug via INTRAVENOUS

## 2018-06-19 MED ORDER — PROPOFOL 10 MG/ML IV BOLUS
INTRAVENOUS | Status: AC
  Filled 2018-06-19: qty 20

## 2018-06-19 MED ORDER — ACETAMINOPHEN 10 MG/ML IV SOLN
INTRAVENOUS | Status: DC | PRN
  Administered 2018-06-19: 650 mg via INTRAVENOUS

## 2018-06-19 MED ORDER — METHOCARBAMOL 1000 MG/10ML IJ SOLN
500.0000 mg | Freq: Four times a day (QID) | INTRAVENOUS | Status: DC | PRN
  Filled 2018-06-19: qty 5

## 2018-06-19 MED ORDER — FENTANYL CITRATE (PF) 250 MCG/5ML IJ SOLN
INTRAMUSCULAR | Status: AC
  Filled 2018-06-19: qty 5

## 2018-06-19 MED ORDER — DEXAMETHASONE SODIUM PHOSPHATE 10 MG/ML IJ SOLN
INTRAMUSCULAR | Status: DC | PRN
  Administered 2018-06-19: 4 mg via INTRAVENOUS

## 2018-06-19 MED ORDER — EPHEDRINE SULFATE-NACL 50-0.9 MG/10ML-% IV SOSY
PREFILLED_SYRINGE | INTRAVENOUS | Status: DC | PRN
  Administered 2018-06-19: 5 mg via INTRAVENOUS

## 2018-06-19 MED ORDER — MORPHINE SULFATE (PF) 2 MG/ML IV SOLN
2.0000 mg | INTRAVENOUS | Status: DC | PRN
  Administered 2018-06-19 – 2018-06-20 (×4): 2 mg via INTRAVENOUS
  Filled 2018-06-19 (×4): qty 1

## 2018-06-19 MED ORDER — ONDANSETRON HCL 4 MG/2ML IJ SOLN
INTRAMUSCULAR | Status: DC | PRN
  Administered 2018-06-19: 4 mg via INTRAVENOUS

## 2018-06-19 MED ORDER — ENOXAPARIN SODIUM 30 MG/0.3ML ~~LOC~~ SOLN
30.0000 mg | SUBCUTANEOUS | Status: DC
  Administered 2018-06-20 – 2018-06-22 (×3): 30 mg via SUBCUTANEOUS
  Filled 2018-06-19 (×3): qty 0.3

## 2018-06-19 MED ORDER — DIPHENHYDRAMINE HCL 12.5 MG/5ML PO ELIX
12.5000 mg | ORAL_SOLUTION | ORAL | Status: DC | PRN
  Administered 2018-06-19 – 2018-06-21 (×3): 25 mg via ORAL
  Administered 2018-06-21: 12.5 mg via ORAL
  Filled 2018-06-19 (×4): qty 10

## 2018-06-19 MED ORDER — METOCLOPRAMIDE HCL 5 MG/ML IJ SOLN: 5.0000 mg | Freq: Three times a day (TID) | INTRAMUSCULAR | Status: DC | PRN

## 2018-06-19 MED ORDER — DONEPEZIL HCL 10 MG PO TABS
10.0000 mg | ORAL_TABLET | Freq: Every day | ORAL | Status: DC
  Administered 2018-06-19 – 2018-06-22 (×4): 10 mg via ORAL
  Filled 2018-06-19 (×4): qty 1

## 2018-06-19 MED ORDER — LOSARTAN POTASSIUM-HCTZ 100-12.5 MG PO TABS: 1.0000 | ORAL_TABLET | Freq: Every day | ORAL | Status: DC

## 2018-06-19 MED ORDER — POLYVINYL ALCOHOL 1.4 % OP SOLN
1.0000 [drp] | Freq: Three times a day (TID) | OPHTHALMIC | Status: DC | PRN
  Filled 2018-06-19: qty 15

## 2018-06-19 MED ORDER — LOSARTAN POTASSIUM 50 MG PO TABS
100.0000 mg | ORAL_TABLET | Freq: Every day | ORAL | Status: DC
  Administered 2018-06-19 – 2018-06-22 (×4): 100 mg via ORAL
  Filled 2018-06-19 (×4): qty 2

## 2018-06-19 MED ORDER — ACETAMINOPHEN 10 MG/ML IV SOLN
INTRAVENOUS | Status: AC
  Filled 2018-06-19: qty 100

## 2018-06-19 MED ORDER — LACTATED RINGERS IV SOLN
INTRAVENOUS | Status: DC
  Administered 2018-06-19 – 2018-06-22 (×7): via INTRAVENOUS

## 2018-06-19 MED ORDER — HYDROCODONE-ACETAMINOPHEN 5-325 MG PO TABS
1.0000 | ORAL_TABLET | ORAL | Status: DC | PRN
  Administered 2018-06-20 – 2018-06-22 (×7): 1 via ORAL
  Filled 2018-06-19 (×7): qty 1

## 2018-06-19 MED ORDER — CEFAZOLIN SODIUM-DEXTROSE 2-4 GM/100ML-% IV SOLN
2.0000 g | INTRAVENOUS | Status: AC
  Administered 2018-06-19: 2 g via INTRAVENOUS
  Filled 2018-06-19: qty 100

## 2018-06-19 MED ORDER — ACETAMINOPHEN 325 MG PO TABS
325.0000 mg | ORAL_TABLET | Freq: Four times a day (QID) | ORAL | Status: DC | PRN
  Administered 2018-06-20: 325 mg via ORAL
  Filled 2018-06-19: qty 2
  Filled 2018-06-19: qty 1

## 2018-06-19 MED ORDER — ACETAMINOPHEN 10 MG/ML IV SOLN: 1000.0000 mg | Freq: Once | INTRAVENOUS | Status: DC | PRN

## 2018-06-19 MED ORDER — POVIDONE-IODINE 10 % EX SWAB
2.0000 "application " | Freq: Once | CUTANEOUS | Status: DC
  Administered 2018-06-19: 2 via TOPICAL

## 2018-06-19 MED ORDER — ONDANSETRON HCL 4 MG/2ML IJ SOLN: 4.0000 mg | Freq: Once | INTRAMUSCULAR | Status: DC | PRN

## 2018-06-19 MED ORDER — METOCLOPRAMIDE HCL 5 MG PO TABS: 5.0000 mg | ORAL_TABLET | Freq: Three times a day (TID) | ORAL | Status: DC | PRN

## 2018-06-19 SURGICAL SUPPLY — 63 items
BANDAGE ACE 4X5 VEL STRL LF (GAUZE/BANDAGES/DRESSINGS) ×5 IMPLANT
BANDAGE ACE 6X5 VEL STRL LF (GAUZE/BANDAGES/DRESSINGS) ×5 IMPLANT
BIT DRILL 3.8X6 NS (BIT) ×2 IMPLANT
BIT DRILL 4.4 NS (BIT) ×2 IMPLANT
BLADE SURG 10 STRL SS (BLADE) ×6 IMPLANT
BNDG COHESIVE 4X5 TAN STRL (GAUZE/BANDAGES/DRESSINGS) ×3 IMPLANT
BNDG GAUZE ELAST 4 BULKY (GAUZE/BANDAGES/DRESSINGS) ×3 IMPLANT
BRUSH SCRUB SURG 4.25 DISP (MISCELLANEOUS) ×6 IMPLANT
CHLORAPREP W/TINT 26ML (MISCELLANEOUS) ×3 IMPLANT
COVER SURGICAL LIGHT HANDLE (MISCELLANEOUS) ×6 IMPLANT
COVER WAND RF STERILE (DRAPES) ×3 IMPLANT
DRAPE C-ARM 42X72 X-RAY (DRAPES) ×3 IMPLANT
DRAPE C-ARMOR (DRAPES) ×3 IMPLANT
DRAPE HALF SHEET 40X57 (DRAPES) ×6 IMPLANT
DRAPE IMP U-DRAPE 54X76 (DRAPES) ×6 IMPLANT
DRAPE INCISE IOBAN 66X45 STRL (DRAPES) IMPLANT
DRAPE ORTHO SPLIT 77X108 STRL (DRAPES) ×6
DRAPE SURG ORHT 6 SPLT 77X108 (DRAPES) ×2 IMPLANT
DRAPE U-SHAPE 47X51 STRL (DRAPES) ×3 IMPLANT
DRSG ADAPTIC 3X8 NADH LF (GAUZE/BANDAGES/DRESSINGS) ×3 IMPLANT
DRSG MEPILEX BORDER 4X8 (GAUZE/BANDAGES/DRESSINGS) ×2 IMPLANT
DRSG PAD ABDOMINAL 8X10 ST (GAUZE/BANDAGES/DRESSINGS) ×6 IMPLANT
ELECT REM PT RETURN 9FT ADLT (ELECTROSURGICAL) ×3
ELECTRODE REM PT RTRN 9FT ADLT (ELECTROSURGICAL) ×1 IMPLANT
GAUZE SPONGE 4X4 12PLY STRL (GAUZE/BANDAGES/DRESSINGS) ×5 IMPLANT
GAUZE XEROFORM 5X9 LF (GAUZE/BANDAGES/DRESSINGS) ×2 IMPLANT
GLOVE BIO SURGEON STRL SZ 6.5 (GLOVE) ×6 IMPLANT
GLOVE BIO SURGEON STRL SZ7.5 (GLOVE) ×12 IMPLANT
GLOVE BIO SURGEONS STRL SZ 6.5 (GLOVE) ×3
GLOVE BIOGEL PI IND STRL 6.5 (GLOVE) ×1 IMPLANT
GLOVE BIOGEL PI IND STRL 7.5 (GLOVE) ×1 IMPLANT
GLOVE BIOGEL PI INDICATOR 6.5 (GLOVE) ×2
GLOVE BIOGEL PI INDICATOR 7.5 (GLOVE) ×2
GOWN STRL REUS W/ TWL LRG LVL3 (GOWN DISPOSABLE) ×2 IMPLANT
GOWN STRL REUS W/TWL LRG LVL3 (GOWN DISPOSABLE) ×6
GUIDEPIN 3.2X17.5 THRD DISP (PIN) ×2 IMPLANT
GUIDEWIRE BALL NOSE 80CM (WIRE) ×2 IMPLANT
KIT BASIN OR (CUSTOM PROCEDURE TRAY) ×3 IMPLANT
KIT TURNOVER KIT B (KITS) ×3 IMPLANT
NAIL TIBIAL 10MMX31.5CM (Nail) ×2 IMPLANT
PACK TOTAL JOINT (CUSTOM PROCEDURE TRAY) ×3 IMPLANT
PAD ARMBOARD 7.5X6 YLW CONV (MISCELLANEOUS) ×6 IMPLANT
PAD CAST 4YDX4 CTTN HI CHSV (CAST SUPPLIES) IMPLANT
PADDING CAST COTTON 4X4 STRL (CAST SUPPLIES) ×3
PADDING CAST COTTON 6X4 STRL (CAST SUPPLIES) ×2 IMPLANT
SCREW ACECAP 36MM (Screw) ×2 IMPLANT
SCREW ACECAP 44MM (Screw) ×2 IMPLANT
SCREW CORTICAL 5.5 35MM (Screw) ×2 IMPLANT
SCREW PROXIMAL DEPUY (Screw) ×6 IMPLANT
SCREW PRXML FT 40X5.5XNS LF (Screw) IMPLANT
SCREW PRXML FT 50X5.5XLCK NS (Screw) IMPLANT
STAPLER VISISTAT 35W (STAPLE) ×3 IMPLANT
SUT ETHILON 3 0 FSL (SUTURE) ×4 IMPLANT
SUT MNCRL AB 3-0 PS2 18 (SUTURE) ×3 IMPLANT
SUT MON AB 2-0 CT1 36 (SUTURE) ×2 IMPLANT
SUT VIC AB 0 CT1 27 (SUTURE)
SUT VIC AB 0 CT1 27XBRD ANBCTR (SUTURE) IMPLANT
SUT VIC AB 2-0 CT1 27 (SUTURE)
SUT VIC AB 2-0 CT1 TAPERPNT 27 (SUTURE) IMPLANT
SUT VLOC 180 0 24IN GS25 (SUTURE) ×2 IMPLANT
TOWEL OR 17X24 6PK STRL BLUE (TOWEL DISPOSABLE) ×3 IMPLANT
TOWEL OR 17X26 10 PK STRL BLUE (TOWEL DISPOSABLE) ×6 IMPLANT
YANKAUER SUCT BULB TIP NO VENT (SUCTIONS) IMPLANT

## 2018-06-19 NOTE — Plan of Care (Signed)
  Problem: Activity: Goal: Ability to increase mobility will improve Outcome: Progressing   Problem: Pain Management: Goal: Pain level will decrease with appropriate interventions Outcome: Progressing   

## 2018-06-19 NOTE — Progress Notes (Addendum)
Patient admitted after midnight.  Agree with overall assessment and plan.  Patient was seen and evaluated and restarted on her home medications of Lamictal for history of seizure disorder, levothyroxine for hypothyroidism, and hydrochlorothiazide-losartan for blood pressure.  Discussed overall goals of care with son who lives in [place] over the phone.  Surgery planned for this afternoon.  Son would like for patient to go to rehab at discharge.  Patient currently has 24-hour caregivers at home.  Social work consulted.

## 2018-06-19 NOTE — Transfer of Care (Signed)
Immediate Anesthesia Transfer of Care Note  Patient: Cathy Morton  Procedure(s) Performed: INTRAMEDULLARY (IM) NAIL TIBIAL (Right Leg Lower)  Patient Location: PACU  Anesthesia Type:General  Level of Consciousness: drowsy and confused  Airway & Oxygen Therapy: Patient Spontanous Breathing and Patient connected to nasal cannula oxygen  Post-op Assessment: Report given to RN and Post -op Vital signs reviewed and stable  Post vital signs: Reviewed and stable  Last Vitals:  Vitals Value Taken Time  BP    Temp    Pulse 65 06/19/2018  4:55 PM  Resp 15 06/19/2018  4:55 PM  SpO2 100 % 06/19/2018  4:55 PM  Vitals shown include unvalidated device data.  Last Pain:  Vitals:   06/19/18 0800  TempSrc:   PainSc: 5       Patients Stated Pain Goal: 5 (06/18/18 2326)  Complications: No apparent anesthesia complications

## 2018-06-19 NOTE — ED Notes (Signed)
Ortho tech at bedside 

## 2018-06-19 NOTE — Discharge Instructions (Signed)
 Dr. Reshma Hoey Adult Hip & Knee Specialist [facility] [address] [place], [place] [address] [phone]   POSTOPERATIVE DIRECTIONS    Hip Rehabilitation, Guidelines Following Surgery   WEIGHT BEARING Weight bearing as tolerated with assist device (walker, cane, etc) as directed, use it as long as suggested by your surgeon or therapist, typically at least 4-6 weeks.   HOME CARE INSTRUCTIONS  Remove items at home which could result in a fall. This includes throw rugs or furniture in walking pathways.  Continue medications as instructed at time of discharge.  You may have some home medications which will be placed on hold until you complete the course of blood thinner medication.  4 days after discharge, you may start showering. No tub baths or soaking your incisions. Do not put on socks or shoes without following the instructions of your caregivers.   Sit on chairs with arms. Use the chair arms to help push yourself up when arising.  Arrange for the use of a toilet seat elevator so you are not sitting low.   Walk with walker as instructed.  You may resume a sexual relationship in one month or when given the OK by your caregiver.  Use walker as long as suggested by your caregivers.  Avoid periods of inactivity such as sitting longer than an hour when not asleep. This helps prevent blood clots.  You may return to work once you are cleared by your surgeon.  Do not drive a car for 6 weeks or until released by your surgeon.  Do not drive while taking narcotics.  Wear elastic stockings for two weeks following surgery during the day but you may remove then at night.  Make sure you keep all of your appointments after your operation with all of your doctors and caregivers. You should call the office at the above phone number and make an appointment for approximately two weeks after the date of your surgery. Please pick up a stool softener and laxative  for home use as long as you are requiring pain medications.  ICE to the affected hip every three hours for 30 minutes at a time and then as needed for pain and swelling. Continue to use ice on the hip for pain and swelling from surgery. You may notice swelling that will progress down to the foot and ankle.  This is normal after surgery.  Elevate the leg when you are not up walking on it.   It is important for you to complete the blood thinner medication as prescribed by your doctor.  Continue to use the breathing machine which will help keep your temperature down.  It is common for your temperature to cycle up and down following surgery, especially at night when you are not up moving around and exerting yourself.  The breathing machine keeps your lungs expanded and your temperature down.  RANGE OF MOTION AND STRENGTHENING EXERCISES  These exercises are designed to help you keep full movement of your hip joint. Follow your caregiver's or physical therapist's instructions. Perform all exercises about fifteen times, three times per day or as directed. Exercise both hips, even if you have had only one joint replacement. These exercises can be done on a training (exercise) mat, on the floor, on a table or on a bed. Use whatever works the best and is most comfortable for you. Use music or television while you are exercising so that the exercises are a pleasant break in your day. This   will make your life better with the exercises acting as a break in routine you can look forward to.  Lying on your back, slowly slide your foot toward your buttocks, raising your knee up off the floor. Then slowly slide your foot back down until your leg is straight again.  Lying on your back spread your legs as far apart as you can without causing discomfort.  Lying on your side, raise your upper leg and foot straight up from the floor as far as is comfortable. Slowly lower the leg and repeat.  Lying on your back, tighten up the  muscle in the front of your thigh (quadriceps muscles). You can do this by keeping your leg straight and trying to raise your heel off the floor. This helps strengthen the largest muscle supporting your knee.  Lying on your back, tighten up the muscles of your buttocks both with the legs straight and with the knee bent at a comfortable angle while keeping your heel on the floor.   SKILLED REHAB INSTRUCTIONS: If the patient is transferred to a skilled rehab facility following release from the hospital, a list of the current medications will be sent to the facility for the patient to continue.  When discharged from the skilled rehab facility, please have the facility set up the patient's Home Health Physical Therapy prior to being released. Also, the skilled facility will be responsible for providing the patient with their medications at time of release from the facility to include their pain medication and their blood thinner medication. If the patient is still at the rehab facility at time of the two week follow up appointment, the skilled rehab facility will also need to assist the patient in arranging follow up appointment in our office and any transportation needs.  MAKE SURE YOU:  Understand these instructions.  Will watch your condition.  Will get help right away if you are not doing well or get worse.  Pick up stool softner and laxative for home use following surgery while on pain medications. Daily dry dressing changes as needed. In 4 days, you may remove your dressings and begin taking showers - no tub baths or soaking the incisions. Continue to use ice for pain and swelling after surgery. Do not use any lotions or creams on the incision until instructed by your surgeon.   

## 2018-06-19 NOTE — Consult Note (Addendum)
Reason for Consult:Right tibia fx Referring Physician: [name] is an 85 y.o. female.  HPI: Cathy Morton was at home with a care giver when she suddenly got up, tripped, and fell. She had immediate pain and was brought to the ED for evaluation. X-rays showed a right tibia fx. She was admitted by IM and orthopedic surgery was consulted. She had fairly advanced dementia and could not contribute to history. Cathy Morton is here with pt and thinks son is in [place].  Past Medical History:  Diagnosis Date  . Anxiety   . Dementia (HCC)   . Hypertension   . Hypothyroidism   . Seizures (HCC)   . Thyroid disease     Past Surgical History:  Procedure Laterality Date  . IR VERTEBROPLASTY CERV/THOR BX INC UNI/BIL INC/INJECT/IMAGING  04/25/2018  . NO PAST SURGERIES      No family history on file.  Social History:  reports that she has never smoked. She has never used smokeless tobacco. She reports that she does not drink alcohol or use drugs.  Allergies:  Allergies  Allergen Reactions  . Tramadol     Medications: I have reviewed the patient's current medications.  Results for orders placed or performed during the hospital encounter of 06/18/18 (from the past 48 hour(s))  CBC with Differential     Status: None   Collection Time: 06/19/18 12:25 AM  Result Value Ref Range   WBC 6.8 4.0 - 10.5 K/uL   RBC 4.12 3.87 - 5.11 MIL/uL   Hemoglobin 12.4 12.0 - 15.0 g/dL   HCT 31.5 94.5 - 85.9 %   MCV 95.4 80.0 - 100.0 fL   MCH 30.1 26.0 - 34.0 pg   MCHC 31.6 30.0 - 36.0 g/dL   RDW 29.2 44.6 - 28.6 %   Platelets 238 150 - 400 K/uL   nRBC 0.0 0.0 - 0.2 %   Neutrophils Relative % 76 %   Neutro Abs 5.2 1.7 - 7.7 K/uL   Lymphocytes Relative 15 %   Lymphs Abs 1.0 0.7 - 4.0 K/uL   Monocytes Relative 8 %   Monocytes Absolute 0.5 0.1 - 1.0 K/uL   Eosinophils Relative 0 %   Eosinophils Absolute 0.0 0.0 - 0.5 K/uL   Basophils Relative 1 %   Basophils Absolute 0.0 0.0 - 0.1 K/uL   Immature  Granulocytes 0 %   Abs Immature Granulocytes 0.03 0.00 - 0.07 K/uL    Comment: Performed at [facility], [address], [place], [place] [address]  Basic metabolic panel     Status: Abnormal   Collection Time: 06/19/18 12:25 AM  Result Value Ref Range   Sodium 139 135 - 145 mmol/L   Potassium 3.7 3.5 - 5.1 mmol/L   Chloride 100 98 - 111 mmol/L   CO2 27 22 - 32 mmol/L   Glucose, Bld 125 (H) 70 - 99 mg/dL   BUN 28 (H) 8 - 23 mg/dL   Creatinine, Ser 1.16 0.44 - 1.00 mg/dL   Calcium 9.1 8.9 - 57.9 mg/dL   GFR calc non Af Amer 49 (L) >60 mL/min   GFR calc Af Amer 56 (L) >60 mL/min   Anion gap 12 5 - 15    Comment: Performed at [facility], [address], [place], [place] [address]  Basic metabolic panel     Status: Abnormal   Collection Time: 06/19/18  6:05 AM  Result Value Ref Range   Sodium 140 135 -  145 mmol/L   Potassium 3.9 3.5 - 5.1 mmol/L   Chloride 100 98 - 111 mmol/L   CO2 30 22 - 32 mmol/L   Glucose, Bld 117 (H) 70 - 99 mg/dL   BUN 25 (H) 8 - 23 mg/dL   Creatinine, Ser 1.610.90 0.44 - 1.00 mg/dL   Calcium 9.2 8.9 - 09.610.3 mg/dL   GFR calc non Af Amer 55 (L) >60 mL/min   GFR calc Af Amer >60 >60 mL/min   Anion gap 10 5 - 15    Comment: Performed at [facility], [address] [place], [place] [address]  CBC     Status: None   Collection Time: 06/19/18  6:05 AM  Result Value Ref Range   WBC 6.1 4.0 - 10.5 K/uL   RBC 3.98 3.87 - 5.11 MIL/uL   Hemoglobin 12.1 12.0 - 15.0 g/dL   HCT 98.137.5 19.136.0 - 47.846.0 %   MCV 94.2 80.0 - 100.0 fL   MCH 30.4 26.0 - 34.0 pg   MCHC 32.3 30.0 - 36.0 g/dL   RDW 29.513.1 62.111.5 - 30.815.5 %   Platelets 242 150 - 400 K/uL   nRBC 0.0 0.0 - 0.2 %    Comment: Performed at [facility], [address] [place], [place] [address]    Dg Tibia/fibula Right  Result Date: 06/19/2018 CLINICAL DATA:  Initial evaluation for acute trauma, fall. EXAM: RIGHT TIBIA AND FIBULA - 2 VIEW COMPARISON:  None. FINDINGS: Acute oblique mildly  comminuted fracture of the distal right tibial shaft with slight medial and posterior displacement. Additional acute oblique fracture of the proximal right fibular shaft with slight anterior and medial displacement. Underlying osteopenia noted. Associated mild diffuse soft tissue swelling. Vascular calcifications noted. IMPRESSION: 1. Acute oblique comminuted fracture of the distal right tibial shaft with medial and posterior displacement. 2. Acute oblique minimally displaced fracture of the proximal right fibular shaft with medial and anterior displacement. Electronically Signed   By: Rise MuBenjamin  McClintock M.D.   On: 06/19/2018 00:02   Dg Ankle Complete Right  Result Date: 06/19/2018 CLINICAL DATA:  Initial evaluation for acute trauma, fall. EXAM: RIGHT ANKLE - COMPLETE 3+ VIEW COMPARISON:  None. FINDINGS: Examination somewhat degraded by motion artifact. Transverse lucency extending through the medial malleolus suspicious for nondisplaced medial malleolar fracture, somewhat age indeterminate, and could be chronic. Comminuted acute distal right tibial shaft fracture again noted. Ankle mortise approximated. Tibial plafond and talar dome intact. Plantar calcaneal enthesophyte. Degenerative spurring noted at the dorsal midfoot. Soft tissue swelling present about the distal tibial shaft fracture. Vascular calcifications noted. IMPRESSION: 1. Transverse lucency extending through the medial malleolus, somewhat age indeterminate, but favored to be chronic in nature. Correlation with physical exam for possible pain at this location recommended. No other acute osseous abnormality about the right ankle. 2. Acute comminuted oblique distal right tibial shaft fracture, better evaluated on concomitant radiograph of the right tibia/fibula. Electronically Signed   By: Rise MuBenjamin  McClintock M.D.   On: 06/19/2018 00:11   Dg Foot Complete Right  Result Date: 06/19/2018 CLINICAL DATA:  Initial evaluation for acute trauma, fall.  EXAM: RIGHT FOOT COMPLETE - 3+ VIEW COMPARISON:  None. FINDINGS: No acute fracture or dislocation. Acro osteolysis noted at the right fifth proximal phalanx. Scattered osteoarthritic changes noted throughout the foot. Plantar calcaneal enthesophyte. Osteopenia. No acute soft tissue abnormality. Vascular calcifications noted. IMPRESSION: No acute osseous abnormality about the right foot. Electronically Signed   By: Janell QuietBenjamin  McClintock M.D.  On: 06/19/2018 00:08    Review of Systems  Unable to perform ROS: Dementia   Blood pressure (!) 147/99, pulse 61, temperature 97.7 F (36.5 C), temperature source Oral, resp. rate 17, height 5\' 5"  (1.651 m), weight 43.6 kg, SpO2 100 %. Physical Exam  Constitutional: She appears well-developed and well-nourished. No distress.  HENT:  Head: Normocephalic and atraumatic.  Eyes: Conjunctivae are normal. Right eye exhibits no discharge. Left eye exhibits no discharge. No scleral icterus.  Neck: Normal range of motion.  Cardiovascular: Normal rate and regular rhythm.  Respiratory: Effort normal. No respiratory distress.  Musculoskeletal:     Comments: RLE No traumatic wounds, ecchymosis, or rash  In long leg splint  Sens DPN, SPN, TN grossly intact  Motor EHL 5/5  No significant edema, cap refill <2s  Neurological: She is alert.  Skin: Skin is warm and dry. She is not diaphoretic.  Psychiatric: She has a normal mood and affect. Her behavior is normal.  medial malleolus is NTTP  Assessment/Plan: Fall Right tibia fx -- Plan IMN this afternoon ~2 by Dr. Linna CapriceSwinteck. NPO until then. Will hold off calling son for consent until later today 2/2 time difference. Multiple medical problems -- per primary service    Freeman CaldronMichael J. Jeffery, PA-C Orthopedic Surgery [phone] 06/19/2018, 9:22 AM     ADDENDUM: Discussed the situation with the family at bed side. Patient's functional status is declining. We discussed that nonoperative treatment of her displaced  tibia fracture will cause immobility which will inevitable lead to her demise. I recommend IMN to stabilize the fracture and allow for mobilization. Obviously surgery is risky at her age, but given the alternative, I think the risk is worth taking.  The risks, benefits, and alternatives were discussed with the patient. There are risks associated with the surgery including, but not limited to, problems with anesthesia (death), infection, differences in leg length/angulation/rotation, fracture of bones, loosening or failure of implants, malunion, nonunion, hematoma (blood accumulation) which may require surgical drainage, blood clots, pulmonary embolism, nerve injury (foot drop), and blood vessel injury. The patient understands these risks and elects to proceed.

## 2018-06-19 NOTE — Anesthesia Postprocedure Evaluation (Signed)
Anesthesia Post Note  Patient: Cathy Morton  Procedure(s) Performed: INTRAMEDULLARY (IM) NAIL TIBIAL (Right Leg Lower)     Patient location during evaluation: PACU Anesthesia Type: General Level of consciousness: awake and alert Pain management: pain level controlled Vital Signs Assessment: post-procedure vital signs reviewed and stable Respiratory status: spontaneous breathing, nonlabored ventilation, respiratory function stable and patient connected to nasal cannula oxygen Cardiovascular status: blood pressure returned to baseline and stable Postop Assessment: no apparent nausea or vomiting Anesthetic complications: no    Last Vitals:  Vitals:   06/19/18 1800 06/19/18 2027  BP: (!) 153/66 132/84  Pulse: 82 85  Resp: 16 20  Temp: 36.6 C 36.4 C  SpO2: 97% 99%    Last Pain:  Vitals:   06/19/18 2027  TempSrc: Oral  PainSc:                  Trevor Iha

## 2018-06-19 NOTE — Progress Notes (Signed)
Report called to short stay. Notified receiving nurse that consents needs to be completed. Caregivers at bedside and given pt's ear rings and  hearing aids. Pt off floor en route to surgical area.

## 2018-06-19 NOTE — Anesthesia Procedure Notes (Signed)
Procedure Name: LMA Insertion Date/Time: 06/19/2018 2:50 PM Performed by: Laruth Bouchard., CRNA Pre-anesthesia Checklist: Patient identified, Emergency Drugs available, Suction available, Patient being monitored and Timeout performed Patient Re-evaluated:Patient Re-evaluated prior to induction Oxygen Delivery Method: Circle system utilized Preoxygenation: Pre-oxygenation with 100% oxygen Induction Type: IV induction Ventilation: Mask ventilation without difficulty LMA: LMA inserted LMA Size: 3.0 Number of attempts: 1 Placement Confirmation: positive ETCO2 and breath sounds checked- equal and bilateral Tube secured with: Tape Dental Injury: Teeth and Oropharynx as per pre-operative assessment

## 2018-06-19 NOTE — H&P (Signed)
History and Physical    Cathy Mannanlma T Stolp ZOX:[id number] DOB: [birth year] DOA: 06/18/2018  PCP: Geoffry ParadiseAronson, Richard, MD  Patient coming from: home  Chief Complaint: Fall  HPI: Cathy Morton is a 85 y.o. female with medical history significant of dementia, hypertension, seizures was at home tonight and got into an argument with her sitter suddenly got up and tripped and fell injuring her leg.  Patient was in normal state of health prior to this.  She is not had any recent illnesses.  She normally walks with a walker.  Dr. Linna CapriceSwinteck with orthopedic surgery was called by the emergency department advised long-leg splint and that surgical intervention at some point would be needed.  Review of Systems: As per HPI otherwise 10 point review of systems negative but highly unreliable due to her dementia  Past Medical History:  Diagnosis Date  . Anxiety   . Dementia (HCC)   . Hypertension   . Hypothyroidism   . Seizures (HCC)   . Thyroid disease     Past Surgical History:  Procedure Laterality Date  . IR VERTEBROPLASTY CERV/THOR BX INC UNI/BIL INC/INJECT/IMAGING  04/25/2018  . NO PAST SURGERIES       reports that she has never smoked. She has never used smokeless tobacco. She reports that she does not drink alcohol or use drugs.  No Known Allergies  No family history on file.  No premature coronary artery disease  Prior to Admission medications   Medication Sig Start Date End Date Taking? Authorizing Provider  carboxymethylcellulose (REFRESH PLUS) 0.5 % SOLN Place 1 drop into both eyes 3 (three) times daily as needed (dry eyes).     [provider]  diclofenac sodium (VOLTAREN) 1 % GEL Apply 2 g topically 4 (four) times daily. 03/20/18   [provider]  donepezil (ARICEPT) 10 MG tablet Take 10 mg by mouth daily. 10/17/16   [provider]  lamoTRIgine (LAMICTAL) 25 MG tablet Take two tablets by mouth twice a day Patient taking differently: Take 50 mg by mouth 2  (two) times daily. Take two tablets by mouth twice a day 02/27/18   Nilda RiggsMartin, Nancy Carolyn, NP  levothyroxine (SYNTHROID, LEVOTHROID) 100 MCG tablet Take 100 mcg by mouth daily before breakfast.    [provider]  losartan-hydrochlorothiazide (HYZAAR) 100-12.5 MG tablet Take 1 tablet by mouth daily. 11/21/15   [provider]  Multiple Vitamins-Minerals (PRESERVISION AREDS 2+MULTI VIT PO) Take 1 tablet by mouth every evening.    [provider]  traMADol (ULTRAM) 50 MG tablet Take 50 mg by mouth every 6 (six) hours as needed for moderate pain.     [provider]    Physical Exam: Vitals:   06/18/18 2233 06/18/18 2239  BP: (!) 149/67   Pulse: 61   Resp: 18   Temp: 97.7 F (36.5 C)   TempSrc: Oral   SpO2: 98%   Weight:  48 kg  Height:  5\' 5"  (1.651 m)      Constitutional: NAD, calm, comfortable Vitals:   06/18/18 2233 06/18/18 2239  BP: (!) 149/67   Pulse: 61   Resp: 18   Temp: 97.7 F (36.5 C)   TempSrc: Oral   SpO2: 98%   Weight:  48 kg  Height:  5\' 5"  (1.651 m)   Eyes: PERRL, lids and conjunctivae normal ENMT: Mucous membranes are moist. Posterior pharynx clear of any exudate or lesions.Normal dentition.  Neck: normal, supple, no masses, no thyromegaly Respiratory: clear to auscultation bilaterally,  no wheezing, no crackles. Normal respiratory effort. No accessory muscle use.  Cardiovascular: Regular rate and rhythm, no murmurs / rubs / gallops. No extremity edema. 2+ pedal pulses. No carotid bruits.  Abdomen: no tenderness, no masses palpated. No hepatosplenomegaly. Bowel sounds positive.  Musculoskeletal: no clubbing / cyanosis. No joint deformity upper and lower extremities. Good ROM, no contractures. Normal muscle tone.  Skin: no rashes, lesions, ulcers. No induration Neurologic: CN 2-12 grossly intact. Sensation intact, DTR normal. Strength 5/5 in all 4.  Psychiatric: Normal judgment and insight. Alert and oriented x 3. Normal  mood.    Labs on Admission: I have personally reviewed following labs and imaging studies  CBC: Recent Labs  Lab 06/19/18 0025  WBC 6.8  NEUTROABS 5.2  HGB 12.4  HCT 39.3  MCV 95.4  PLT 238   Basic Metabolic Panel: No results for input(s): NA, K, CL, CO2, GLUCOSE, BUN, CREATININE, CALCIUM, MG, PHOS in the last 168 hours. GFR: CrCl cannot be calculated (Patient's most recent lab result is older than the maximum 21 days allowed.). Liver Function Tests: No results for input(s): AST, ALT, ALKPHOS, BILITOT, PROT, ALBUMIN in the last 168 hours. No results for input(s): LIPASE, AMYLASE in the last 168 hours. No results for input(s): AMMONIA in the last 168 hours. Coagulation Profile: No results for input(s): INR, PROTIME in the last 168 hours. Cardiac Enzymes: No results for input(s): CKTOTAL, CKMB, CKMBINDEX, TROPONINI in the last 168 hours. BNP (last 3 results) No results for input(s): PROBNP in the last 8760 hours. HbA1C: No results for input(s): HGBA1C in the last 72 hours. CBG: No results for input(s): GLUCAP in the last 168 hours. Lipid Profile: No results for input(s): CHOL, HDL, LDLCALC, TRIG, CHOLHDL, LDLDIRECT in the last 72 hours. Thyroid Function Tests: No results for input(s): TSH, T4TOTAL, FREET4, T3FREE, THYROIDAB in the last 72 hours. Anemia Panel: No results for input(s): VITAMINB12, FOLATE, FERRITIN, TIBC, IRON, RETICCTPCT in the last 72 hours. Urine analysis:    Component Value Date/Time   COLORURINE YELLOW 05/01/2018 1245   APPEARANCEUR HAZY (A) 05/01/2018 1245   LABSPEC 1.009 05/01/2018 1245   PHURINE 7.0 05/01/2018 1245   GLUCOSEU NEGATIVE 05/01/2018 1245   HGBUR NEGATIVE 05/01/2018 1245   BILIRUBINUR NEGATIVE 05/01/2018 1245   KETONESUR NEGATIVE 05/01/2018 1245   PROTEINUR NEGATIVE 05/01/2018 1245   UROBILINOGEN 0.2 12/10/2009 1640   NITRITE NEGATIVE 05/01/2018 1245   LEUKOCYTESUR NEGATIVE 05/01/2018 1245   Sepsis Labs:  !!!!!!!!!!!!!!!!!!!!!!!!!!!!!!!!!!!!!!!!!!!! @LABRCNTIP (procalcitonin:4,lacticidven:4) )No results found for this or any previous visit (from the past 240 hour(s)).   Radiological Exams on Admission: Dg Tibia/fibula Right  Result Date: 06/19/2018 CLINICAL DATA:  Initial evaluation for acute trauma, fall. EXAM: RIGHT TIBIA AND FIBULA - 2 VIEW COMPARISON:  None. FINDINGS: Acute oblique mildly comminuted fracture of the distal right tibial shaft with slight medial and posterior displacement. Additional acute oblique fracture of the proximal right fibular shaft with slight anterior and medial displacement. Underlying osteopenia noted. Associated mild diffuse soft tissue swelling. Vascular calcifications noted. IMPRESSION: 1. Acute oblique comminuted fracture of the distal right tibial shaft with medial and posterior displacement. 2. Acute oblique minimally displaced fracture of the proximal right fibular shaft with medial and anterior displacement. Electronically Signed   By: Rise Mu M.D.   On: 06/19/2018 00:02   Dg Ankle Complete Right  Result Date: 06/19/2018 CLINICAL DATA:  Initial evaluation for acute trauma, fall. EXAM: RIGHT ANKLE - COMPLETE 3+ VIEW COMPARISON:  None. FINDINGS: Examination somewhat degraded by  motion artifact. Transverse lucency extending through the medial malleolus suspicious for nondisplaced medial malleolar fracture, somewhat age indeterminate, and could be chronic. Comminuted acute distal right tibial shaft fracture again noted. Ankle mortise approximated. Tibial plafond and talar dome intact. Plantar calcaneal enthesophyte. Degenerative spurring noted at the dorsal midfoot. Soft tissue swelling present about the distal tibial shaft fracture. Vascular calcifications noted. IMPRESSION: 1. Transverse lucency extending through the medial malleolus, somewhat age indeterminate, but favored to be chronic in nature. Correlation with physical exam for possible pain at this  location recommended. No other acute osseous abnormality about the right ankle. 2. Acute comminuted oblique distal right tibial shaft fracture, better evaluated on concomitant radiograph of the right tibia/fibula. Electronically Signed   By: Rise Mu M.D.   On: 06/19/2018 00:11   Dg Foot Complete Right  Result Date: 06/19/2018 CLINICAL DATA:  Initial evaluation for acute trauma, fall. EXAM: RIGHT FOOT COMPLETE - 3+ VIEW COMPARISON:  None. FINDINGS: No acute fracture or dislocation. Acro osteolysis noted at the right fifth proximal phalanx. Scattered osteoarthritic changes noted throughout the foot. Plantar calcaneal enthesophyte. Osteopenia. No acute soft tissue abnormality. Vascular calcifications noted. IMPRESSION: No acute osseous abnormality about the right foot. Electronically Signed   By: Rise Mu M.D.   On: 06/19/2018 00:08    EKG: Pending Case discussed with Dr. Elesa Massed in the ED Old chart reviewed  Assessment/Plan 85 year old elderly female with dementia status post mechanical fall suffered a right tib-fib fracture Principal Problem:   Tibia/fibula fracture, right, closed, initial encounter-observe overnight for orthopedic evaluation.  Will likely need surgical intervention.  Hold anticoagulants.  Keep n.p.o. after midnight.  Dr. Linna Caprice called and is aware of consultation with orthopedic team.  They will see in the morning.  Placed long-leg splint.  PRN morphine ordered for pain.  Active Problems:   Essential hypertension-clarify resume home meds   Dementia (HCC)-stable   Seizures (HCC)-placed on seizure precautions   Med rec pending pharmacy review  DVT prophylaxis: None Code Status: Full Family Communication: none Disposition Plan:  After surgery Consults called: ortho Admission status: observation   DAVID,RACHAL A MD [facility]  If 7PM-7AM, please contact night-coverage [email or web address] Password [facility]  06/19/2018, 12:54 AM

## 2018-06-19 NOTE — Progress Notes (Signed)
Pt having increased pain. Will need to give her pain medication to place foley catheter. On call MD notified.

## 2018-06-19 NOTE — Op Note (Signed)
OPERATIVE REPORT   06/19/2018  4:45 PM  PATIENT:  Cathy Morton   SURGEON:  Jonette PesaBrian J Stepheny Canal, MD  ASSISTANT:  Hart CarwinJustin Queen, RNFA.   PREOPERATIVE DIAGNOSIS: Closed right tibia / fibula shaft fracture.  POSTOPERATIVE DIAGNOSIS:  Same.  PROCEDURE: Intramedullary fixation right tibia. Closed treatment right fibula shaft fracture. Interpretation of fluoroscopic images.  ANESTHESIA:   GETA.  ANTIBIOTICS:  2 g Ancef.  IMPLANTS: Biomet Ace tibial nail size 10 x 315 mm. 5.5 mm proximal interlocking screw x2. 4.5 mm distal interlocking screw x2.  SPECIMENS:  None.  COMPLICATIONS:  None.  DISPOSITION:  Stable to PACU.  SURGICAL INDICATIONS:  Cathy Mannanlma T Ledger is a 85 y.o. female with a diagnosis of closed right tibia/fibula shaft fracture.  The fracture is displaced and unstable.  She was indicated for intramedullary fixation.  Risk, benefits, and alternatives were explained, and the family elected to proceed.  The risks, benefits, and alternatives were discussed with the patient / family preoperatively including but not limited to the risks of infection, bleeding, nerve / blood vessel injury, malunion, nonunion, cardiopulmonary complications, the need for repeat surgery, among others, and the patient was willing to proceed.  PROCEDURE IN DETAIL: The patient was identified in the holding area using 2 identifiers.  The surgical site was marked by myself.  She was taken to the operating room, and placed supine on the operating room table.  She was positioned on a bone foam positioner.  All bony prominences were well-padded.  The right lower extremity was prepped and draped in the normal sterile surgical fashion.  Timeout was called, verifying site and site of surgery.  She did receive IV antibiotics within 60 minutes of beginning the procedure.  I began by using fluoroscopy to define her anatomy.  I made a longitudinal incision over the lateral border of the patella.  Blunt dissection  was performed until I reached the extensor mechanism.  A limited lateral peripatellar arthrotomy was created.  I used blunt digital dissection to develop the plane between the fat pad and the patellar tendon.  I used the awl to find the standard starting point for a tibial nail.  I placed a guide pin under AP and lateral fluoroscopic control.  Entry reamer was used to open up the canal.  I placed a guidewire down to the level of the fracture.  The fracture was reduced with traction and rotation.  I used a percutaneous clamp to hold the reduction.  The guidewire was then passed to the physeal scar of the ankle.  I measured the length of the nail using a lateral radiograph of the knee.  I sequentially reamed up to an 11 mm reamer with acceptable chatter.  I selected a 10 x 315 mm nail.  The nail was assembled onto the jig on the back table.  The nail was impacted into the tibia without any difficulty.  Insertion depth was checked on lateral views of the ankle and knee.  I placed a total of 3 proximal interlocking screws through stab incisions using standard AO technique.  I then used perfect circles to place 2 distal interlocking screws using standard AO technique.  The clamp was removed.  The jig was removed proximally.  AP and lateral views were obtained of the knee, ankle, and fracture site.  I performed a live external rotation stress test.  There was no displacement of the medial malleolus.  There was no lateral shifting of the talus or widening of the medial  clear space.  The wounds were copious irrigated with saline.  The arthrotomy was closed with 0 Vicryl and strata fix suture.  Deep dermal tissue was closed with 2-0 Monocryl, and 2-0 nylon for the skin.  Sterile dressing was applied followed by a bulky Jones dressing.  Patient was then awakened from anesthesia and then transferred to the PACU in stable condition.  Sponge, needle, and instrument counts were correct at the end of the case x2.  There were  no known complications.  POSTOPERATIVE PLAN: Postoperatively, the patient be readmitted to the hospitalist service.  She may weight-bear as tolerated for the purpose of transferring.  Begin Lovenox for DVT prophylaxis.  She will work with physical and occupational therapy and undergo disposition planning.  She will return to the office in 2 weeks for routine postoperative care.

## 2018-06-19 NOTE — Anesthesia Preprocedure Evaluation (Addendum)
Anesthesia Evaluation  Patient identified by MRN, date of birth, ID band Patient awake    Reviewed: Allergy & Precautions, NPO status , Patient's Chart, lab work & pertinent test results  Airway Mallampati: II  TM Distance: >3 FB Neck ROM: Full    Dental no notable dental hx. (+) Dental Advisory Given, Upper Dentures   Pulmonary neg pulmonary ROS,    Pulmonary exam normal breath sounds clear to auscultation       Cardiovascular Exercise Tolerance: Good hypertension, Pt. on medications Normal cardiovascular exam Rhythm:Regular Rate:Normal  06/19/2018 EKG  SR W RBBB  R 72   Neuro/Psych Seizures -,  PSYCHIATRIC DISORDERS Dementia    GI/Hepatic Neg liver ROS, GERD  Medicated,  Endo/Other  Hypothyroidism   Renal/GU negative Renal ROS     Musculoskeletal negative musculoskeletal ROS (+)   Abdominal   Peds  Hematology negative hematology ROS (+)   Anesthesia Other Findings   Reproductive/Obstetrics                           Lab Results  Component Value Date   WBC 6.1 06/19/2018   HGB 12.1 06/19/2018   HCT 37.5 06/19/2018   MCV 94.2 06/19/2018   PLT 242 06/19/2018    Lab Results  Component Value Date   CREATININE 0.90 06/19/2018   BUN 25 (H) 06/19/2018   NA 140 06/19/2018   K 3.9 06/19/2018   CL 100 06/19/2018   CO2 30 06/19/2018    Anesthesia Physical Anesthesia Plan  ASA: III  Anesthesia Plan: General   Post-op Pain Management:    Induction: Intravenous  PONV Risk Score and Plan: 3 and Treatment may vary due to age or medical condition, Ondansetron and Dexamethasone  Airway Management Planned: LMA  Additional Equipment:   Intra-op Plan:   Post-operative Plan:   Informed Consent: I have reviewed the patients History and Physical, chart, labs and discussed the procedure including the risks, benefits and alternatives for the proposed anesthesia with the patient or  authorized representative who has indicated his/her understanding and acceptance.    Discussed DNR with power of attorney and Suspend DNR.   Dental advisory given  Plan Discussed with: CRNA  Anesthesia Plan Comments:       Anesthesia Quick Evaluation

## 2018-06-20 ENCOUNTER — Encounter (HOSPITAL_COMMUNITY): Payer: Self-pay | Admitting: Orthopedic Surgery

## 2018-06-20 NOTE — Progress Notes (Addendum)
    Subjective:  Patient reports pain as moderate to severe.  Denies N/V/CP/SOB. C/o R leg pain.  Objective:   VITALS:   Vitals:   06/20/18 0305 06/20/18 0818 06/20/18 1021 06/20/18 1319  BP: (!) 153/80 115/65 (!) 142/54 120/72  Pulse: 77 79 (!) 58 72  Resp:  16 16 16   Temp: 98.3 F (36.8 C) 98 F (36.7 C) 98.4 F (36.9 C) 98 F (36.7 C)  TempSrc: Axillary Oral Oral Oral  SpO2: 98% 98% 97% 99%  Weight:      Height:        NAD, pleasantly confused ABD soft Sensation intact distally Intact pulses distally Dorsiflexion/Plantar flexion intact Incision: dressing C/D/I Compartment soft No increased pain with active ROM No pain with passive stretch   Lab Results  Component Value Date   WBC 6.9 06/19/2018   HGB 12.1 06/19/2018   HCT 36.9 06/19/2018   MCV 94.1 06/19/2018   PLT 227 06/19/2018   BMET    Component Value Date/Time   NA 140 06/19/2018 0605   K 3.9 06/19/2018 0605   CL 100 06/19/2018 0605   CO2 30 06/19/2018 0605   GLUCOSE 117 (H) 06/19/2018 0605   BUN 25 (H) 06/19/2018 0605   CREATININE 0.81 06/19/2018 1830   CALCIUM 9.2 06/19/2018 0605   GFRNONAA >60 06/19/2018 1830   GFRAA >60 06/19/2018 1830     Assessment/Plan: 1 Day Post-Op   Principal Problem:   Tibia/fibula fracture, right, closed, initial encounter Active Problems:   Essential hypertension   Dementia (HCC)   Seizures (HCC)   Open displaced comminuted fracture of shaft of right tibia   WBAT with walker for transfers Ice, elevation DVT ppx: Lovenox, SCDs, TEDS PO pain control PT/OT Dispo: SNF placement   Iline Oven Cashus Halterman 06/20/2018, 1:29 PM   Samson Frederic, MD Cell: [phone] [facility] is now [facility]  [place] [address], [address], [place], [place] [address] Phone: [phone] [email or web address] Facebook  [employer]

## 2018-06-20 NOTE — Plan of Care (Signed)
  Problem: Activity: Goal: Ability to increase mobility will improve Outcome: Progressing   Problem: Pain Management: Goal: Pain level will decrease with appropriate interventions Outcome: Progressing   Problem: Skin Integrity: Goal: Will show signs of wound healing Outcome: Progressing   

## 2018-06-20 NOTE — Evaluation (Signed)
Occupational Therapy Evaluation Patient Details Name: Cathy Mannanlma T Smeltz MRN: [id number] DOB: May 02, 1923 Today's Date: 06/20/2018    History of Present Illness Cathy Morton is a 85 y.o. female with medical history significant of dementia, hypertension, seizures, fall with L1 compression fx in 2017 was at home and got into an argument with her sitter suddenly got up and tripped and fell sustaining R tib/fib fx, underwent fixation on 06/19/18.    Clinical Impression   PTA, pt with 24/7 caregivers who assist with BADLs; pt uses RW for functional mobility. Pt currently requiring Mod A for UB ADLs, Mod-Max A for LB ADLs, and Mod A +2 for functional transfers with RW. Pt requiring Max cues and perseverating on topic of her hearing aides (which where in her ears). Pt would benefit from further acute OT to facilitate safe dc. Recommend dc to SNF for further OT to optimize safety, independence with ADLs, and return to PLOF.      Follow Up Recommendations  SNF;Supervision/Assistance - 24 hour    Equipment Recommendations  Other (comment)(Defer to next venue)    Recommendations for Other Services PT consult     Precautions / Restrictions Precautions Precautions: Fall Restrictions Weight Bearing Restrictions: Yes RLE Weight Bearing: Weight bearing as tolerated      Mobility Bed Mobility Overal bed mobility: Needs Assistance Bed Mobility: Supine to Sit     Supine to sit: Mod assist;+2 for physical assistance     General bed mobility comments: bed mobility had to be initiated as pt avoids mobility as soon as it hurts, mod A +2 at LE's and trunk to pivot to EOB and slide fwd. Pt with ability to sit straight up into long sitting  Transfers Overall transfer level: Needs assistance Equipment used: Rolling walker (2 wheeled) Transfers: Sit to/from UGI CorporationStand;Stand Pivot Transfers Sit to Stand: Mod assist;+2 physical assistance Stand pivot transfers: Mod assist;+2 physical assistance        General transfer comment: pt putting little to no wt RLE with significant R lean as well as not extending elbows fully when coming to standing, mod A +2 for power up and support. Needed assist moving RW with pivot but was taking small hops on LLE    Balance Overall balance assessment: Needs assistance;History of Falls Sitting-balance support: No upper extremity supported Sitting balance-Leahy Scale: Fair     Standing balance support: Bilateral upper extremity supported Standing balance-Leahy Scale: Poor Standing balance comment: heavily reliant on UE support                           ADL either performed or assessed with clinical judgement   ADL Overall ADL's : Needs assistance/impaired Eating/Feeding: Supervision/ safety;Set up(Requires cues) Eating/Feeding Details (indicate cue type and reason): Discussed need to eat for healing. Pt's caregiver reporting she hasnt been eating Grooming: Minimal assistance;Sitting   Upper Body Bathing: Moderate assistance;Sitting   Lower Body Bathing: Moderate assistance;+2 for physical assistance;Sit to/from stand   Upper Body Dressing : Moderate assistance;Sitting   Lower Body Dressing: Maximal assistance;+2 for physical assistance;Sit to/from stand Lower Body Dressing Details (indicate cue type and reason): Donned socks at bed level. Mod A +2 for OOB activity and transfer             Functional mobility during ADLs: Moderate assistance;+2 for physical assistance;Rolling walker(stand pivot) General ADL Comments: Pt with decreased cognition and confused. Pt requiring increased assistnace due to cognition deficits.      Vision  Baseline Vision/History: Wears glasses Wears Glasses: At all times Patient Visual Report: No change from baseline       Perception     Praxis      Pertinent Vitals/Pain Pain Assessment: Faces Faces Pain Scale: Hurts even more Pain Location: R lower leg Pain Descriptors / Indicators:  Grimacing;Guarding Pain Intervention(s): Limited activity within patient's tolerance;Monitored during session;Repositioned     Hand Dominance Right   Extremity/Trunk Assessment Upper Extremity Assessment Upper Extremity Assessment: Generalized weakness   Lower Extremity Assessment Lower Extremity Assessment: Defer to PT evaluation RLE Deficits / Details: R knee with ~20 deg of flexion and pt would not press straight with cues, did not force the issue at this point with her agitation, hip flex 2/5 RLE: Unable to fully assess due to pain RLE Sensation: WNL RLE Coordination: decreased gross motor   Cervical / Trunk Assessment Cervical / Trunk Assessment: Kyphotic   Communication Communication Communication: HOH   Cognition Arousal/Alertness: Awake/alert Behavior During Therapy: Restless;Agitated Overall Cognitive Status: History of cognitive impairments - at baseline                                 General Comments: pt confused and very internally distracted by wanting all 4 of her hearing aids, needs lots of redirection, easily agitated   General Comments  Caregiver and niece present during session    Exercises Exercises: General Lower Extremity General Exercises - Lower Extremity Ankle Circles/Pumps: AROM;Both;10 reps;Seated Quad Sets: AROM;Both;5 reps;Seated   Shoulder Instructions      Home Living Family/patient expects to be discharged to:: Private residence Living Arrangements: Non-relatives/Friends Available Help at Discharge: Family;Personal care attendant Type of Home: House Home Access: Stairs to enter Entergy CorporationEntrance Stairs-Number of Steps: 3 Entrance Stairs-Rails: Left Home Layout: One level     Bathroom Shower/Tub: Chief Strategy OfficerTub/shower unit   Bathroom Toilet: Standard     Home Equipment: Environmental consultantWalker - 2 wheels;Shower seat;Bedside commode;Wheelchair - manual;Cane - single point   Additional Comments: caregivers 24/7      Prior Functioning/Environment  Level of Independence: Needs assistance  Gait / Transfers Assistance Needed: ambulates with RW independently ADL's / Homemaking Assistance Needed: asisst for sponge bathing and twice a week in the shower for hair washing, needs assist with dressing            OT Problem List: Decreased strength;Decreased range of motion;Decreased activity tolerance;Impaired balance (sitting and/or standing);Decreased knowledge of use of DME or AE;Decreased knowledge of precautions;Decreased safety awareness;Decreased cognition;Pain      OT Treatment/Interventions: Self-care/ADL training;Therapeutic exercise;Energy conservation;DME and/or AE instruction;Therapeutic activities;Patient/family education    OT Goals(Current goals can be found in the care plan section) Acute Rehab OT Goals Patient Stated Goal: none stated OT Goal Formulation: With patient Time For Goal Achievement: 07/04/18 Potential to Achieve Goals: Good ADL Goals Pt Will Perform Upper Body Dressing: with min assist;sitting Pt Will Perform Lower Body Dressing: with mod assist;sit to/from stand Pt Will Transfer to Toilet: stand pivot transfer;bedside commode;with min assist Pt Will Perform Toileting - Clothing Manipulation and hygiene: with min guard assist;sit to/from stand;sitting/lateral leans Additional ADL Goal #1: Pt will perform bed mobility with supervision in preparation for ADLs.  OT Frequency: Min 2X/week   Barriers to D/C:            Co-evaluation PT/OT/SLP Co-Evaluation/Treatment: Yes Reason for Co-Treatment: Necessary to address cognition/behavior during functional activity;For patient/therapist safety;To address functional/ADL transfers PT goals addressed during session: Mobility/safety  with mobility;Balance;Proper use of DME;Strengthening/ROM OT goals addressed during session: ADL's and self-care      AM-PAC OT "6 Clicks" Daily Activity     Outcome Measure Help from another person eating meals?: A Little Help from  another person taking care of personal grooming?: A Little Help from another person toileting, which includes using toliet, bedpan, or urinal?: A Lot Help from another person bathing (including washing, rinsing, drying)?: A Lot Help from another person to put on and taking off regular upper body clothing?: A Lot Help from another person to put on and taking off regular lower body clothing?: A Lot 6 Click Score: 14   End of Session Equipment Utilized During Treatment: Gait belt;Rolling walker Nurse Communication: Mobility status;Weight bearing status  Activity Tolerance: Patient limited by pain;Treatment limited secondary to agitation Patient left: in chair;with call bell/phone within reach;with chair alarm set;with family/visitor present  OT Visit Diagnosis: Unsteadiness on feet (R26.81);Other abnormalities of gait and mobility (R26.89);Muscle weakness (generalized) (M62.81);Other symptoms and signs involving cognitive function;Pain Pain - Right/Left: Right Pain - part of body: Leg                Time: 9518-8416 OT Time Calculation (min): 24 min Charges:  OT General Charges $OT Visit: 1 Visit OT Evaluation $OT Eval Moderate Complexity: 1 Mod  Marthe Dant MSOT, OTR/L Acute Rehab Pager: [phone] Office: [phone]  Theodoro Grist Kwesi Sangha 06/20/2018, 9:43 AM

## 2018-06-20 NOTE — NC FL2 (Signed)
[place] MEDICAID FL2 LEVEL OF CARE SCREENING TOOL     IDENTIFICATION  Patient Name: Cathy Morton Birthdate: 13-Aug-1922 Sex: female Admission Date (Current Location): 06/18/2018  [facility] and [place] Number:  Producer, television/film/video and Address:  [facility] [place] [address] [place], [place] [address]      Provider Number: [id number]  Attending Physician Name and Address:  Barnetta Chapel, MD  Relative Name and Phone Number:  [name], son, [phone]    Current Level of Care: Hospital Recommended Level of Care: Skilled Nursing Facility Prior Approval Number:    Date Approved/Denied:   PASRR Number:   [id number]  Discharge Plan: SNF    Current Diagnoses: Patient Active Problem List   Diagnosis Date Noted  . Tibia fracture 06/19/2018  . Open displaced comminuted fracture of shaft of right tibia 06/19/2018  . Tibia/fibula fracture, right, closed, initial encounter   . Seizures (HCC) 09/29/2016  . Reactive depression 09/29/2016  . Essential hypertension 01/28/2016  . Hypothyroid 01/28/2016  . Anxiety 01/28/2016  . Dementia (HCC) 01/28/2016  . Compression fracture of first lumbar vertebra (HCC) 01/28/2016    Orientation RESPIRATION BLADDER Height & Weight     Self  Normal External catheter, Continent Weight: 43.6 kg Height:  5\' 5"  (165.1 cm)  BEHAVIORAL SYMPTOMS/MOOD NEUROLOGICAL BOWEL NUTRITION STATUS      Continent Diet(Please see DC Summary)  AMBULATORY STATUS COMMUNICATION OF NEEDS Skin   Limited Assist Verbally Surgical wounds(Closed incision on leg)                       Personal Care Assistance Level of Assistance  Bathing, Feeding, Dressing Bathing Assistance: Maximum assistance Feeding assistance: Independent Dressing Assistance: Limited assistance     Functional Limitations Info  Sight, Hearing, Speech   Hearing Info: Impaired(Hearing aid) Speech Info: Adequate    SPECIAL CARE FACTORS FREQUENCY  PT (By  licensed PT), OT (By licensed OT)     PT Frequency: 5x/week OT Frequency: 3x/week            Contractures Contractures Info: Not present    Additional Factors Info  Code Status, Allergies Code Status Info: DNR Allergies Info: Tramadol           Current Medications (06/20/2018):  This is the current hospital active medication list Current Facility-Administered Medications  Medication Dose Route Frequency Provider Last Rate Last Dose  . acetaminophen (TYLENOL) tablet 325-650 mg  325-650 mg Oral Q6H PRN Swinteck, Arlys John, MD      . ceFAZolin (ANCEF) IVPB 2g/100 mL premix  2 g Intravenous Q6H Swinteck, Arlys John, MD 200 mL/hr at 06/20/18 0304 2 g at 06/20/18 0304  . diphenhydrAMINE (BENADRYL) 12.5 MG/5ML elixir 12.5-25 mg  12.5-25 mg Oral Q4H PRN Samson Frederic, MD   25 mg at 06/19/18 2220  . docusate sodium (COLACE) capsule 100 mg  100 mg Oral BID Samson Frederic, MD   100 mg at 06/20/18 1028  . donepezil (ARICEPT) tablet 10 mg  10 mg Oral Daily Samson Frederic, MD   10 mg at 06/20/18 1028  . enoxaparin (LOVENOX) injection 30 mg  30 mg Subcutaneous Q24H Swinteck, Arlys John, MD   30 mg at 06/20/18 1029  . hydrochlorothiazide (MICROZIDE) capsule 12.5 mg  12.5 mg Oral Daily Samson Frederic, MD   12.5 mg at 06/20/18 1029  . HYDROcodone-acetaminophen (NORCO/VICODIN) 5-325 MG per tablet 1-2 tablet  1-2 tablet Oral Q4H PRN Samson Frederic, MD      .  lactated ringers infusion   Intravenous Continuous Samson Frederic, MD 75 mL/hr at 06/19/18 2317    . lamoTRIgine (LAMICTAL) tablet 50 mg  50 mg Oral BID Samson Frederic, MD   50 mg at 06/20/18 1029  . levothyroxine (SYNTHROID, LEVOTHROID) tablet 100 mcg  100 mcg Oral QAC breakfast Samson Frederic, MD   100 mcg at 06/20/18 1029  . losartan (COZAAR) tablet 100 mg  100 mg Oral Daily Samson Frederic, MD   100 mg at 06/20/18 1028  . methocarbamol (ROBAXIN) tablet 500 mg  500 mg Oral Q6H PRN Swinteck, Arlys John, MD       Or  . methocarbamol (ROBAXIN) 500 mg in  dextrose 5 % 50 mL IVPB  500 mg Intravenous Q6H PRN Swinteck, Arlys John, MD      . metoCLOPramide (REGLAN) tablet 5-10 mg  5-10 mg Oral Q8H PRN Swinteck, Arlys John, MD       Or  . metoCLOPramide (REGLAN) injection 5-10 mg  5-10 mg Intravenous Q8H PRN Swinteck, Arlys John, MD      . morphine 2 MG/ML injection 2 mg  2 mg Intravenous Q4H PRN Samson Frederic, MD   2 mg at 06/20/18 1517  . ondansetron (ZOFRAN) tablet 4 mg  4 mg Oral Q6H PRN Swinteck, Arlys John, MD       Or  . ondansetron (ZOFRAN) injection 4 mg  4 mg Intravenous Q6H PRN Swinteck, Arlys John, MD      . polyvinyl alcohol (LIQUIFILM TEARS) 1.4 % ophthalmic solution 1 drop  1 drop Both Eyes TID PRN Samson Frederic, MD         Discharge Medications: Please see discharge summary for a list of discharge medications.  Relevant Imaging Results:  Relevant Lab Results:   Additional Information SSN: [id number] [place], [place]

## 2018-06-20 NOTE — Clinical Social Work Note (Signed)
Clinical Social Work Assessment  Patient Details  Name: Cathy [facility] MRN: [id number] Date of Birth: 10/03/1922  Date of referral:  06/20/18               Reason for consult:  Facility Placement                Permission sought to share information with:  Facility Medical sales representative, Family Supports Permission granted to share information::  Yes, Verbal Permission Granted  Name::     Cathy [facility]::  SNFs  Relationship::  Son  Contact Information:  [phone]  Housing/Transportation Living arrangements for the past 2 months:  Single Family Home Source of Information:  Adult Children Patient Interpreter Needed:  None Criminal Activity/Legal Involvement Pertinent to Current Situation/Hospitalization:  No - Comment as needed Significant Relationships:  Adult Children Lives with:  Other (Comment), Self(and paid caregivers) Do you feel safe going back to the place where you live?  No Need for family participation in patient care:  Yes (Comment)  Care giving concerns:  CSW received consult for possible SNF placement at time of discharge. CSW spoke with patient's son regarding PT recommendation of SNF placement at time of discharge. [name] reported that patient's spouse is currently unable to care for patient at their home given patient's current physical needs and fall risk. She currently lives at home alone with paid caregivers with her and son lives next door. [name] expressed understanding of PT recommendation and is agreeable to SNF placement at time of discharge. CSW to continue to follow and assist with discharge planning needs.   Social Worker assessment / plan:  CSW spoke with patient's son concerning possibility of rehab at [facility] before returning home.  Employment status:  Retired Database administrator PT Recommendations:  Skilled Nursing Facility Information / Referral to community resources:  Skilled Nursing Facility  Patient/Family's  Response to care:  [name] recognizes need for rehab before returning home and is agreeable to a SNF in [place]. Patient reported [facility] for [facility], however they are unable to accept patient. CSW awaiting call back from son on a second SNF option so we can begin insurance authorization process.  Patient/Family's Understanding of and Emotional Response to Diagnosis, Current Treatment, and Prognosis:  Patient/family is realistic regarding therapy needs and expressed being hopeful for SNF placement. [name] expressed understanding of CSW role and discharge process as well as medical condition. No questions/concerns about plan or treatment.    Emotional Assessment Appearance:  Appears stated age Attitude/Demeanor/Rapport:  Unable to Assess Affect (typically observed):  Unable to Assess Orientation:  Oriented to Self Alcohol / Substance use:  Not Applicable Psych involvement (Current and /or in the community):  No (Comment)  Discharge Needs  Concerns to be addressed:  Care Coordination Readmission within the last 30 days:  No Current discharge risk:  Cognitively Impaired, Dependent with Mobility Barriers to Discharge:  Continued Medical Work up   [employer], LCSW 06/20/2018, 1:38 PM

## 2018-06-20 NOTE — Evaluation (Signed)
Physical Therapy Evaluation Patient Details Name: Cathy Morton MRN: [id number] DOB: [birth year] Today's Date: 06/20/2018   History of Present Illness  Cathy Morton is a 85 y.o. female with medical history significant of dementia, hypertension, seizures, fall with L1 compression fx in 2017 was at home and got into an argument with her sitter suddenly got up and tripped and fell sustaining R tib/fib fx, underwent fixation on 06/19/18.   Clinical Impression  Pt admitted with above diagnosis. Pt currently with functional limitations due to the deficits listed below (see PT Problem List). Pt required mod A +2 for bed mobility and pivot to chair with RW, assist needed partially due to pain and partly due to cognition.  Pt will benefit from skilled PT to increase their independence and safety with mobility to allow discharge to the venue listed below.       Follow Up Recommendations SNF;Supervision/Assistance - 24 hour    Equipment Recommendations  None recommended by PT    Recommendations for Other Services       Precautions / Restrictions Precautions Precautions: Fall Restrictions Weight Bearing Restrictions: Yes RLE Weight Bearing: Weight bearing as tolerated      Mobility  Bed Mobility Overal bed mobility: Needs Assistance Bed Mobility: Supine to Sit     Supine to sit: Mod assist;+2 for physical assistance     General bed mobility comments: bed mobility had to be initiated as pt avoids mobility as soon as it hurts, mod A +2 at LE's and trunk to pivot to EOB and slide fwd. Pt with ability to sit straight up into long sitting  Transfers Overall transfer level: Needs assistance Equipment used: Rolling walker (2 wheeled) Transfers: Sit to/from UGI CorporationStand;Stand Pivot Transfers Sit to Stand: Mod assist;+2 physical assistance Stand pivot transfers: Mod assist;+2 physical assistance       General transfer comment: pt putting little to no wt RLE with significant R lean as well as  not extending elbows fully when coming to standing, mod A +2 for power up and support. Needed assist moving RW with pivot but was taking small hops on LLE  Ambulation/Gait             General Gait Details: transfers only in chart per MD  Stairs            Wheelchair Mobility    Modified Rankin (Stroke Patients Only)       Balance Overall balance assessment: Needs assistance;History of Falls Sitting-balance support: No upper extremity supported Sitting balance-Leahy Scale: Fair     Standing balance support: Bilateral upper extremity supported Standing balance-Leahy Scale: Poor Standing balance comment: heavily reliant on UE support                             Pertinent Vitals/Pain Pain Assessment: Faces Faces Pain Scale: Hurts even more Pain Location: R lower leg Pain Descriptors / Indicators: Grimacing;Guarding Pain Intervention(s): Limited activity within patient's tolerance;Monitored during session    Home Living Family/patient expects to be discharged to:: Private residence Living Arrangements: Non-relatives/Friends Available Help at Discharge: Family;Personal care attendant Type of Home: House Home Access: Stairs to enter Entrance Stairs-Rails: Left Entrance Stairs-Number of Steps: 3 Home Layout: One level Home Equipment: Environmental consultantWalker - 2 wheels;Shower seat;Bedside commode;Wheelchair - manual;Cane - single point Additional Comments: caregivers 24/7    Prior Function Level of Independence: Needs assistance   Gait / Transfers Assistance Needed: ambulates with RW independently  ADL's / Homemaking  Assistance Needed: asisst for sponge bathing and twice a week in the shower for hair washing, needs assist with dressing        Hand Dominance   Dominant Hand: Right    Extremity/Trunk Assessment   Upper Extremity Assessment Upper Extremity Assessment: Defer to OT evaluation    Lower Extremity Assessment Lower Extremity Assessment: RLE  deficits/detail;Generalized weakness RLE Deficits / Details: R knee with ~20 deg of flexion and pt would not press straight with cues, did not force the issue at this point with her agitation, hip flex 2/5 RLE: Unable to fully assess due to pain RLE Sensation: WNL RLE Coordination: decreased gross motor    Cervical / Trunk Assessment Cervical / Trunk Assessment: Kyphotic  Communication   Communication: HOH  Cognition Arousal/Alertness: Awake/alert Behavior During Therapy: Restless;Agitated Overall Cognitive Status: History of cognitive impairments - at baseline                                 General Comments: pt confused and very internally distracted by wanting all 4 of her hearing aids, needs lots of redirection, easily agitated      General Comments General comments (skin integrity, edema, etc.): reviewed AP, LAQ, HS, LAQ, hip abd/add but pt willing to try only a couple    Exercises General Exercises - Lower Extremity Ankle Circles/Pumps: AROM;Both;10 reps;Seated Quad Sets: AROM;Both;5 reps;Seated   Assessment/Plan    PT Assessment Patient needs continued PT services  PT Problem List Decreased strength;Decreased range of motion;Decreased activity tolerance;Decreased balance;Decreased mobility;Decreased coordination;Decreased cognition;Decreased knowledge of use of DME;Decreased knowledge of precautions;Decreased safety awareness;Pain       PT Treatment Interventions DME instruction;Functional mobility training;Therapeutic activities;Therapeutic exercise;Balance training;Neuromuscular re-education;Cognitive remediation;Patient/family education    PT Goals (Current goals can be found in the Care Plan section)  Acute Rehab PT Goals Patient Stated Goal: none stated PT Goal Formulation: Patient unable to participate in goal setting Time For Goal Achievement: 07/04/18 Potential to Achieve Goals: Good    Frequency Min 3X/week   Barriers to discharge         Co-evaluation PT/OT/SLP Co-Evaluation/Treatment: Yes Reason for Co-Treatment: Necessary to address cognition/behavior during functional activity;For patient/therapist safety PT goals addressed during session: Mobility/safety with mobility;Balance;Proper use of DME;Strengthening/ROM         AM-PAC PT "6 Clicks" Mobility  Outcome Measure Help needed turning from your back to your side while in a flat bed without using bedrails?: A Lot Help needed moving from lying on your back to sitting on the side of a flat bed without using bedrails?: A Lot Help needed moving to and from a bed to a chair (including a wheelchair)?: A Lot Help needed standing up from a chair using your arms (e.g., wheelchair or bedside chair)?: A Lot Help needed to walk in hospital room?: Total Help needed climbing 3-5 steps with a railing? : Total 6 Click Score: 10    End of Session Equipment Utilized During Treatment: Gait belt Activity Tolerance: Patient tolerated treatment well Patient left: in chair;with call bell/phone within reach;with chair alarm set Nurse Communication: Mobility status PT Visit Diagnosis: Unsteadiness on feet (R26.81);Repeated falls (R29.6);Muscle weakness (generalized) (M62.81);History of falling (Z91.81);Difficulty in walking, not elsewhere classified (R26.2);Pain Pain - Right/Left: Right Pain - part of body: Leg    Time: 2774-1287 PT Time Calculation (min) (ACUTE ONLY): 24 min   Charges:   PT Evaluation $PT Eval Moderate Complexity: 1 Mod  Lyanne CoVictoria Kirston Luty, PT  Acute Rehab Services  Pager [phone] Office [phone]   [facility] 06/20/2018, 9:26 AM

## 2018-06-20 NOTE — Progress Notes (Signed)
PROGRESS NOTE    Cathy [facility]  ZOX:[id number] DOB: 10/17/1922 DOA: 06/18/2018 PCP: Geoffry ParadiseAronson, Richard, MD  Outpatient Specialists:   Brief Narrative: Cathy [facility] is a 85 year old female with past medical history significant for dementia, hypertension and seizures.  Patient tripped and fell falling on argument with the sitter, with subsequent closed right tibia-fibula fracture.  Patient underwent Intramedullary fixation of right tibia and closed treatment of right fibula shaft fracture yesterday 06/19/2018.  No new complaints today.  Seen alongside patient's daughter.  Assessment & Plan:   Principal Problem:   Tibia/fibula fracture, right, closed, initial encounter Active Problems:   Essential hypertension   Dementia (HCC)   Seizures (HCC)   Open displaced comminuted fracture of shaft of right tibia  Closed right tibia/fibula fracture: -Patient has undergone Intramedullary fixation of right tibia and closed treatment of right fibula shaft fracture. -Orthopedic team is directing postop management. -Pain control is optimized. -Discharge to skilled nursing facility when a bed is available.  Essential hypertension: Optimized. Last documented blood pressure was 120/72 mmHg Continue current management.  Dementia: Stable. No behavioral problems noted.  Seizures: On seizure precautions.    DVT prophylaxis:  Subcu Lovenox Code Status: Full Family Communication:  Daughter  Disposition plan:   Skilled nursing facility.   Consults called: ortho  Procedures:  Intramedullary fixation of right tibia and closed treatment of right fibula shaft fracture  Antimicrobials:   None   Subjective: No complaints. No significant history from patient.  Objective: Vitals:   06/20/18 0305 06/20/18 0818 06/20/18 1021 06/20/18 1319  BP: (!) 153/80 115/65 (!) 142/54 120/72  Pulse: 77 79 (!) 58 72  Resp:  16 16 16   Temp: 98.3 F (36.8 C) 98 F (36.7 C) 98.4 F (36.9 C) 98 F (36.7  C)  TempSrc: Axillary Oral Oral Oral  SpO2: 98% 98% 97% 99%  Weight:      Height:        Intake/Output Summary (Last 24 hours) at 06/20/2018 1747 Last data filed at 06/20/2018 1329 Gross per 24 hour  Intake 1611.14 ml  Output 550 ml  Net 1061.14 ml   Filed Weights   06/18/18 2239 06/19/18 0500  Weight: 48 kg 43.6 kg    Examination:  General exam: Appears calm and comfortable. Respiratory system: Clear to auscultation.  Cardiovascular system: S1 & S2 heard, RRR. No JVD, murmurs, rubs, gallops or clicks.  Gastrointestinal system: Abdomen is nondistended, soft and nontender. No organomegaly or masses felt. Normal bowel sounds heard. Central nervous system: Alert and oriented. No focal neurological deficits.  Data Reviewed: I have personally reviewed following labs and imaging studies  CBC: Recent Labs  Lab 06/19/18 0025 06/19/18 0605 06/19/18 1830  WBC 6.8 6.1 6.9  NEUTROABS 5.2  --   --   HGB 12.4 12.1 12.1  HCT 39.3 37.5 36.9  MCV 95.4 94.2 94.1  PLT 238 242 227   Basic Metabolic Panel: Recent Labs  Lab 06/19/18 0025 06/19/18 0605 06/19/18 1830  NA 139 140  --   K 3.7 3.9  --   CL 100 100  --   CO2 27 30  --   GLUCOSE 125* 117*  --   BUN 28* 25*  --   CREATININE 1.00 0.90 0.81  CALCIUM 9.1 9.2  --    GFR: Estimated Creatinine Clearance: 29.9 mL/min (by C-G formula based on SCr of 0.81 mg/dL). Liver Function Tests: No results for input(s): AST, ALT, ALKPHOS, BILITOT, PROT, ALBUMIN in the last 168  hours. No results for input(s): LIPASE, AMYLASE in the last 168 hours. No results for input(s): AMMONIA in the last 168 hours. Coagulation Profile: No results for input(s): INR, PROTIME in the last 168 hours. Cardiac Enzymes: No results for input(s): CKTOTAL, CKMB, CKMBINDEX, TROPONINI in the last 168 hours. BNP (last 3 results) No results for input(s): PROBNP in the last 8760 hours. HbA1C: No results for input(s): HGBA1C in the last 72 hours. CBG: No  results for input(s): GLUCAP in the last 168 hours. Lipid Profile: No results for input(s): CHOL, HDL, LDLCALC, TRIG, CHOLHDL, LDLDIRECT in the last 72 hours. Thyroid Function Tests: No results for input(s): TSH, T4TOTAL, FREET4, T3FREE, THYROIDAB in the last 72 hours. Anemia Panel: No results for input(s): VITAMINB12, FOLATE, FERRITIN, TIBC, IRON, RETICCTPCT in the last 72 hours. Urine analysis:    Component Value Date/Time   COLORURINE YELLOW 05/01/2018 1245   APPEARANCEUR HAZY (A) 05/01/2018 1245   LABSPEC 1.009 05/01/2018 1245   PHURINE 7.0 05/01/2018 1245   GLUCOSEU NEGATIVE 05/01/2018 1245   HGBUR NEGATIVE 05/01/2018 1245   BILIRUBINUR NEGATIVE 05/01/2018 1245   KETONESUR NEGATIVE 05/01/2018 1245   PROTEINUR NEGATIVE 05/01/2018 1245   UROBILINOGEN 0.2 12/10/2009 1640   NITRITE NEGATIVE 05/01/2018 1245   LEUKOCYTESUR NEGATIVE 05/01/2018 1245   Sepsis Labs: @LABRCNTIP (procalcitonin:4,lacticidven:4)  ) Recent Results (from the past 240 hour(s))  Surgical PCR screen     Status: None   Collection Time: 06/19/18 10:53 AM  Result Value Ref Range Status   MRSA, PCR NEGATIVE NEGATIVE Final   Staphylococcus aureus NEGATIVE NEGATIVE Final    Comment: (NOTE) The Xpert SA Assay (FDA approved for NASAL specimens in patients 45 years of age and older), is one component of a comprehensive surveillance program. It is not intended to diagnose infection nor to guide or monitor treatment. Performed at [facility], [address] [place], [place] [address]          Radiology Studies: Dg Tibia/fibula Right  Result Date: 06/19/2018 CLINICAL DATA:  ORIF right tibial fracture. EXAM: RIGHT TIBIA AND FIBULA - 2 VIEW; DG C-ARM 61-120 MIN COMPARISON:  06/18/2018 FINDINGS: Interval placement of intramedullary nail bridging patient's distal tibial diaphyseal fracture with hardware intact and anatomic alignment over the fracture site. Evidence of patient's known minimally displaced  fibular fracture. IMPRESSION: Anatomic alignment over the mid to distal tibial diaphyseal fracture site post internal fixation. Stable known minimally displaced fibular fracture. Electronically Signed   By: Elberta Fortis M.D.   On: 06/19/2018 16:35   Dg Tibia/fibula Right  Result Date: 06/19/2018 CLINICAL DATA:  Initial evaluation for acute trauma, fall. EXAM: RIGHT TIBIA AND FIBULA - 2 VIEW COMPARISON:  None. FINDINGS: Acute oblique mildly comminuted fracture of the distal right tibial shaft with slight medial and posterior displacement. Additional acute oblique fracture of the proximal right fibular shaft with slight anterior and medial displacement. Underlying osteopenia noted. Associated mild diffuse soft tissue swelling. Vascular calcifications noted. IMPRESSION: 1. Acute oblique comminuted fracture of the distal right tibial shaft with medial and posterior displacement. 2. Acute oblique minimally displaced fracture of the proximal right fibular shaft with medial and anterior displacement. Electronically Signed   By: Rise Mu M.D.   On: 06/19/2018 00:02   Dg Ankle Complete Right  Result Date: 06/19/2018 CLINICAL DATA:  Initial evaluation for acute trauma, fall. EXAM: RIGHT ANKLE - COMPLETE 3+ VIEW COMPARISON:  None. FINDINGS: Examination somewhat degraded by motion artifact. Transverse lucency extending through the medial malleolus suspicious for  nondisplaced medial malleolar fracture, somewhat age indeterminate, and could be chronic. Comminuted acute distal right tibial shaft fracture again noted. Ankle mortise approximated. Tibial plafond and talar dome intact. Plantar calcaneal enthesophyte. Degenerative spurring noted at the dorsal midfoot. Soft tissue swelling present about the distal tibial shaft fracture. Vascular calcifications noted. IMPRESSION: 1. Transverse lucency extending through the medial malleolus, somewhat age indeterminate, but favored to be chronic in nature. Correlation  with physical exam for possible pain at this location recommended. No other acute osseous abnormality about the right ankle. 2. Acute comminuted oblique distal right tibial shaft fracture, better evaluated on concomitant radiograph of the right tibia/fibula. Electronically Signed   By: Rise Mu M.D.   On: 06/19/2018 00:11   Dg Chest Port 1 View  Result Date: 06/19/2018 CLINICAL DATA:  Fall. EXAM: PORTABLE CHEST 1 VIEW COMPARISON:  01/13/2018 FINDINGS: The heart size is normal. Extensive aortic atherosclerosis. No pleural effusion or edema identified. No airspace opacities. The bones appear osteopenic. A treated compression deformity is noted within the midthoracic spine. No displaced rib fractures. IMPRESSION: 1. No acute findings. 2.  Aortic Atherosclerosis (ICD10-I70.0). Electronically Signed   By: Signa Kell M.D.   On: 06/19/2018 13:03   Dg Tibia/fibula Right Port  Result Date: 06/19/2018 CLINICAL DATA:  85 year old female EXAM: PORTABLE RIGHT TIBIA AND FIBULA - 2 VIEW COMPARISON:  Intraoperative radiographs obtained earlier today; preoperative radiographs 06/18/2018 FINDINGS: Interval surgical repair of tibial fracture with an intramedullary nail including 3 proximal interlocking screws and 2 distal interlocking screws. Alignment of the main fracture fragments is improved compared to the preoperative radiographs. There is a new incomplete fracture along the proximal medial diaphysis at the site of the third interlocking screw, likely splint ring from screw placement. Nondisplaced distal fibular fracture is unchanged. IMPRESSION: ORIF of distal tibial diaphyseal fracture with an intramedullary nail including 3 proximal and 2 distal interlocking screws. Of note, there is a new fracture in the proximal tibial diaphysis beginning at the third interlocking screw. Electronically Signed   By: Malachy Moan M.D.   On: 06/19/2018 17:43   Dg Foot Complete Right  Result Date:  06/19/2018 CLINICAL DATA:  Initial evaluation for acute trauma, fall. EXAM: RIGHT FOOT COMPLETE - 3+ VIEW COMPARISON:  None. FINDINGS: No acute fracture or dislocation. Acro osteolysis noted at the right fifth proximal phalanx. Scattered osteoarthritic changes noted throughout the foot. Plantar calcaneal enthesophyte. Osteopenia. No acute soft tissue abnormality. Vascular calcifications noted. IMPRESSION: No acute osseous abnormality about the right foot. Electronically Signed   By: Rise Mu M.D.   On: 06/19/2018 00:08   Dg C-arm 1-60 Min  Result Date: 06/19/2018 CLINICAL DATA:  ORIF right tibial fracture. EXAM: RIGHT TIBIA AND FIBULA - 2 VIEW; DG C-ARM 61-120 MIN COMPARISON:  06/18/2018 FINDINGS: Interval placement of intramedullary nail bridging patient's distal tibial diaphyseal fracture with hardware intact and anatomic alignment over the fracture site. Evidence of patient's known minimally displaced fibular fracture. IMPRESSION: Anatomic alignment over the mid to distal tibial diaphyseal fracture site post internal fixation. Stable known minimally displaced fibular fracture. Electronically Signed   By: Elberta Fortis M.D.   On: 06/19/2018 16:35        Scheduled Meds: . docusate sodium  100 mg Oral BID  . donepezil  10 mg Oral Daily  . enoxaparin (LOVENOX) injection  30 mg Subcutaneous Q24H  . hydrochlorothiazide  12.5 mg Oral Daily  . lamoTRIgine  50 mg Oral BID  . levothyroxine  100 mcg Oral QAC  breakfast  . losartan  100 mg Oral Daily   Continuous Infusions: . lactated ringers 75 mL/hr at 06/20/18 1328  . methocarbamol (ROBAXIN) IV       LOS: 1 day    Time spent: 35 minutes    Berton MountSylvester , MD  [facility] Pager #: [phone] 7PM-7AM contact night coverage as above

## 2018-06-21 NOTE — Progress Notes (Signed)
PROGRESS NOTE    Cathy Morton  WUG:[id number] DOB: July 16, 1922 DOA: 06/18/2018 PCP: Geoffry Paradise, MD  Outpatient Specialists:   Brief Narrative: Cathy Morton is a 85 year old female with past medical history significant for dementia, hypertension and seizures.  Patient tripped and fell falling on argument with the sitter, with subsequent closed right tibia-fibula fracture.  Patient underwent Intramedullary fixation of right tibia and closed treatment of right fibula shaft fracture yesterday 06/19/2018.  No new complaints today.  Seen alongside patient's daughter.  06/21/2018: No complaints.  Patient remains stable.  Pursue disposition.  Assessment & Plan:   Principal Problem:   Tibia/fibula fracture, right, closed, initial encounter Active Problems:   Essential hypertension   Dementia (HCC)   Seizures (HCC)   Open displaced comminuted fracture of shaft of right tibia  Closed right tibia/fibula fracture: -Patient has undergone Intramedullary fixation of right tibia and closed treatment of right fibula shaft fracture. -Orthopedic team is directing postop management. -Pain control is optimized. -Discharge to skilled nursing facility when a bed is available.  Essential hypertension: Optimized. Last documented blood pressure was 120/72 mmHg Continue current management.  Dementia: Stable. No behavioral problems noted.  Seizures: On seizure precautions.    DVT prophylaxis:  Subcu Lovenox Code Status: Full Family Communication:  Daughter  Disposition plan:   Skilled nursing facility.   Consults called: ortho  Procedures:  Intramedullary fixation of right tibia and closed treatment of right fibula shaft fracture  Antimicrobials:   None   Subjective: No complaints.  Seen alongside patient's daughter and niece. No significant history from patient.  Objective: Vitals:   06/20/18 2054 06/21/18 0347 06/21/18 0855 06/21/18 1300  BP: 139/84 (!) 165/77 117/64  120/74  Pulse: 71 69  65  Resp: 18 20  19   Temp: 98.9 F (37.2 C) 98.3 F (36.8 C)  98.7 F (37.1 C)  TempSrc: Oral Oral  Oral  SpO2: 99% 98%    Weight:      Height:        Intake/Output Summary (Last 24 hours) at 06/21/2018 1538 Last data filed at 06/21/2018 1300 Gross per 24 hour  Intake 720 ml  Output 1270 ml  Net -550 ml   Filed Weights   06/18/18 2239 06/19/18 0500  Weight: 48 kg 43.6 kg    Examination:  General exam: Appears calm and comfortable. Respiratory system: Clear to auscultation.  Cardiovascular system: S1 & S2 heard, RRR. No JVD, murmurs, rubs, gallops or clicks.  Gastrointestinal system: Abdomen is nondistended, soft and nontender. No organomegaly or masses felt. Normal bowel sounds heard. Central nervous system: Alert and oriented. No focal neurological deficits.  Data Reviewed: I have personally reviewed following labs and imaging studies  CBC: Recent Labs  Lab 06/19/18 0025 06/19/18 0605 06/19/18 1830  WBC 6.8 6.1 6.9  NEUTROABS 5.2  --   --   HGB 12.4 12.1 12.1  HCT 39.3 37.5 36.9  MCV 95.4 94.2 94.1  PLT 238 242 227   Basic Metabolic Panel: Recent Labs  Lab 06/19/18 0025 06/19/18 0605 06/19/18 1830  NA 139 140  --   K 3.7 3.9  --   CL 100 100  --   CO2 27 30  --   GLUCOSE 125* 117*  --   BUN 28* 25*  --   CREATININE 1.00 0.90 0.81  CALCIUM 9.1 9.2  --    GFR: Estimated Creatinine Clearance: 29.9 mL/min (by C-G formula based on SCr of 0.81 mg/dL). Liver Function Tests: No  results for input(s): AST, ALT, ALKPHOS, BILITOT, PROT, ALBUMIN in the last 168 hours. No results for input(s): LIPASE, AMYLASE in the last 168 hours. No results for input(s): AMMONIA in the last 168 hours. Coagulation Profile: No results for input(s): INR, PROTIME in the last 168 hours. Cardiac Enzymes: No results for input(s): CKTOTAL, CKMB, CKMBINDEX, TROPONINI in the last 168 hours. BNP (last 3 results) No results for input(s): PROBNP in the last 8760  hours. HbA1C: No results for input(s): HGBA1C in the last 72 hours. CBG: No results for input(s): GLUCAP in the last 168 hours. Lipid Profile: No results for input(s): CHOL, HDL, LDLCALC, TRIG, CHOLHDL, LDLDIRECT in the last 72 hours. Thyroid Function Tests: No results for input(s): TSH, T4TOTAL, FREET4, T3FREE, THYROIDAB in the last 72 hours. Anemia Panel: No results for input(s): VITAMINB12, FOLATE, FERRITIN, TIBC, IRON, RETICCTPCT in the last 72 hours. Urine analysis:    Component Value Date/Time   COLORURINE YELLOW 05/01/2018 1245   APPEARANCEUR HAZY (A) 05/01/2018 1245   LABSPEC 1.009 05/01/2018 1245   PHURINE 7.0 05/01/2018 1245   GLUCOSEU NEGATIVE 05/01/2018 1245   HGBUR NEGATIVE 05/01/2018 1245   BILIRUBINUR NEGATIVE 05/01/2018 1245   KETONESUR NEGATIVE 05/01/2018 1245   PROTEINUR NEGATIVE 05/01/2018 1245   UROBILINOGEN 0.2 12/10/2009 1640   NITRITE NEGATIVE 05/01/2018 1245   LEUKOCYTESUR NEGATIVE 05/01/2018 1245   Sepsis Labs: @LABRCNTIP (procalcitonin:4,lacticidven:4)  ) Recent Results (from the past 240 hour(s))  Surgical PCR screen     Status: None   Collection Time: 06/19/18 10:53 AM  Result Value Ref Range Status   MRSA, PCR NEGATIVE NEGATIVE Final   Staphylococcus aureus NEGATIVE NEGATIVE Final    Comment: (NOTE) The Xpert SA Assay (FDA approved for NASAL specimens in patients 75 years of age and older), is one component of a comprehensive surveillance program. It is not intended to diagnose infection nor to guide or monitor treatment. Performed at [facility], [address] [place], [place] [address]          Radiology Studies: Dg Tibia/fibula Right  Result Date: 06/19/2018 CLINICAL DATA:  ORIF right tibial fracture. EXAM: RIGHT TIBIA AND FIBULA - 2 VIEW; DG C-ARM 61-120 MIN COMPARISON:  06/18/2018 FINDINGS: Interval placement of intramedullary nail bridging patient's distal tibial diaphyseal fracture with hardware intact and anatomic  alignment over the fracture site. Evidence of patient's known minimally displaced fibular fracture. IMPRESSION: Anatomic alignment over the mid to distal tibial diaphyseal fracture site post internal fixation. Stable known minimally displaced fibular fracture. Electronically Signed   By: Elberta Fortis M.D.   On: 06/19/2018 16:35   Dg Tibia/fibula Right Port  Result Date: 06/19/2018 CLINICAL DATA:  85 year old female EXAM: PORTABLE RIGHT TIBIA AND FIBULA - 2 VIEW COMPARISON:  Intraoperative radiographs obtained earlier today; preoperative radiographs 06/18/2018 FINDINGS: Interval surgical repair of tibial fracture with an intramedullary nail including 3 proximal interlocking screws and 2 distal interlocking screws. Alignment of the main fracture fragments is improved compared to the preoperative radiographs. There is a new incomplete fracture along the proximal medial diaphysis at the site of the third interlocking screw, likely splint ring from screw placement. Nondisplaced distal fibular fracture is unchanged. IMPRESSION: ORIF of distal tibial diaphyseal fracture with an intramedullary nail including 3 proximal and 2 distal interlocking screws. Of note, there is a new fracture in the proximal tibial diaphysis beginning at the third interlocking screw. Electronically Signed   By: Malachy Moan M.D.   On: 06/19/2018 17:43   Dg C-arm 1-60  Min  Result Date: 06/19/2018 CLINICAL DATA:  ORIF right tibial fracture. EXAM: RIGHT TIBIA AND FIBULA - 2 VIEW; DG C-ARM 61-120 MIN COMPARISON:  06/18/2018 FINDINGS: Interval placement of intramedullary nail bridging patient's distal tibial diaphyseal fracture with hardware intact and anatomic alignment over the fracture site. Evidence of patient's known minimally displaced fibular fracture. IMPRESSION: Anatomic alignment over the mid to distal tibial diaphyseal fracture site post internal fixation. Stable known minimally displaced fibular fracture. Electronically Signed    By: Elberta Fortisaniel  Boyle M.D.   On: 06/19/2018 16:35        Scheduled Meds: . docusate sodium  100 mg Oral BID  . donepezil  10 mg Oral Daily  . enoxaparin (LOVENOX) injection  30 mg Subcutaneous Q24H  . hydrochlorothiazide  12.5 mg Oral Daily  . lamoTRIgine  50 mg Oral BID  . levothyroxine  100 mcg Oral QAC breakfast  . losartan  100 mg Oral Daily   Continuous Infusions: . lactated ringers 75 mL/hr at 06/21/18 0539  . methocarbamol (ROBAXIN) IV       LOS: 2 days    Time spent: 25 minutes    Berton MountSylvester Gissell Barra, MD  [facility] Pager #: [phone] 7PM-7AM contact night coverage as above

## 2018-06-21 NOTE — Plan of Care (Signed)
  Problem: Activity: Goal: Ability to increase mobility will improve Outcome: Progressing   Problem: Pain Management: Goal: Pain level will decrease with appropriate interventions Outcome: Progressing   Problem: Skin Integrity: Goal: Will show signs of wound healing Outcome: Progressing   

## 2018-06-21 NOTE — Progress Notes (Signed)
Physical Therapy Treatment Patient Details Name: Cathy Morton MRN: [id number] DOB: March 11, 1923 Today's Date: 06/21/2018    History of Present Illness Pt is a 85 y.o. female admitted 06/18/18 after falling at home sustaining R tib/fib fx; s/p intramedullary fixation of R tibia on 06/19/18. PMH includes dementia, HTN, seizures, L1 compression fx (2017).   PT Comments    Pt progressing with mobility. Performed squat pivot transfer to recliner and sit<>stands from recliner with maxA and bilateral HHA. Able to tolerate standing in partial squat ~15-30sec bouts with mod-maxA. Performed RLE AAROM. Pt remains hesitant to move secondary to pain, but willing to participate. Continue to recommend SNF-level therapies to maximize functional mobility and independence prior to return home.    Follow Up Recommendations  SNF;Supervision/Assistance - 24 hour     Equipment Recommendations  None recommended by PT    Recommendations for Other Services       Precautions / Restrictions Precautions Precautions: Fall Restrictions Weight Bearing Restrictions: Yes RLE Weight Bearing: Weight bearing as tolerated Other Position/Activity Restrictions: WBAT for transfers only    Mobility  Bed Mobility Overal bed mobility: Needs Assistance Bed Mobility: Supine to Sit     Supine to sit: Max assist     General bed mobility comments: Pt able to initiate movement to EOB, but quick to stop moving once RLE starts hurting, eventually requiring maxA to assist BLEs to EOB; modA for UE support to assist trunk elevation. Once sitting, pt able to scoot all directions at EOB and edge of recliner with supervision  Transfers Overall transfer level: Needs assistance Equipment used: 1 person hand held assist Transfers: Sit to/from [employer] Sit to Stand: Max assist;Mod assist   Squat pivot transfers: Max assist     General transfer comment: Attempted standing with RW, but pt with significant lean,  reaching prematurely for RW and not WB through RLE. Squat pivot to recliner with bilateral HHA and maxA. Pt able to stand 2x from recliner with mod-maxA, heavy reliance on BUE support to push into standing; improved WB through RLE with this  Ambulation/Gait             General Gait Details: NT- per order, WBAT for transfers only   Stairs             Wheelchair Mobility    Modified Rankin (Stroke Patients Only)       Balance Overall balance assessment: Needs assistance;History of Falls Sitting-balance support: No upper extremity supported Sitting balance-Leahy Scale: Fair     Standing balance support: Bilateral upper extremity supported Standing balance-Leahy Scale: Poor Standing balance comment: heavily reliant on UE support and external assist                            Cognition Arousal/Alertness: Awake/alert Behavior During Therapy: Flat affect Overall Cognitive Status: History of cognitive impairments - at baseline                                 General Comments: Internally distracted by pain; able to answer questions and interact appropriately, but easily distracted and 'checking out' requiring repeated cues and redirection to task. Difficulty following commands      Exercises Other Exercises Other Exercises: Seated AAROM R knee flex/ext, hip flex; pt able to tolerate passive R knee flexion to ~100'    General Comments General comments (skin integrity, edema, etc.):  Niece and caregiver present during session      Pertinent Vitals/Pain Pain Assessment: Faces Faces Pain Scale: Hurts even more Pain Location: R lower leg Pain Descriptors / Indicators: Grimacing;Guarding Pain Intervention(s): Limited activity within patient's tolerance;Monitored during session;Repositioned    Home Living                      Prior Function            PT Goals (current goals can now be found in the care plan section) Acute Rehab PT  Goals Patient Stated Goal: Less pain PT Goal Formulation: With patient Time For Goal Achievement: 07/04/18 Potential to Achieve Goals: Good Progress towards PT goals: Progressing toward goals    Frequency    Min 3X/week      PT Plan Current plan remains appropriate    Co-evaluation              AM-PAC PT "6 Clicks" Mobility   Outcome Measure  Help needed turning from your back to your side while in a flat bed without using bedrails?: A Lot Help needed moving from lying on your back to sitting on the side of a flat bed without using bedrails?: A Lot Help needed moving to and from a bed to a chair (including a wheelchair)?: A Lot Help needed standing up from a chair using your arms (e.g., wheelchair or bedside chair)?: A Lot Help needed to walk in hospital room?: Total Help needed climbing 3-5 steps with a railing? : Total 6 Click Score: 10    End of Session Equipment Utilized During Treatment: Gait belt Activity Tolerance: Patient tolerated treatment well;Patient limited by pain Patient left: in chair;with call bell/phone within reach;with chair alarm set;with family/visitor present Nurse Communication: Mobility status PT Visit Diagnosis: Unsteadiness on feet (R26.81);Repeated falls (R29.6);Muscle weakness (generalized) (M62.81);History of falling (Z91.81);Difficulty in walking, not elsewhere classified (R26.2);Pain Pain - Right/Left: Right Pain - part of body: Leg     Time: 5027-7412 PT Time Calculation (min) (ACUTE ONLY): 32 min  Charges:  $Therapeutic Exercise: 8-22 mins $Therapeutic Activity: 8-22 mins                    Ina Homes, PT, DPT Acute Rehabilitation Services  Pager [phone] Office [phone]  Malachy Chamber 06/21/2018, 12:25 PM

## 2018-06-21 NOTE — Progress Notes (Signed)
    Subjective:  Patient reports pain as moderate to severe.  Denies N/V/CP/SOB. C/o R leg pain.  Objective:   VITALS:   Vitals:   06/20/18 1319 06/20/18 2054 06/21/18 0347 06/21/18 0855  BP: 120/72 139/84 (!) 165/77 117/64  Pulse: 72 71 69   Resp: 16 18 20    Temp: 98 F (36.7 C) 98.9 F (37.2 C) 98.3 F (36.8 C)   TempSrc: Oral Oral Oral   SpO2: 99% 99% 98%   Weight:      Height:        NAD, pleasantly confused ABD soft Sensation intact distally Intact pulses distally Dorsiflexion/Plantar flexion intact Incision: dressing C/D/I Compartment soft No increased pain with active ROM No pain with passive stretch   Lab Results  Component Value Date   WBC 6.9 06/19/2018   HGB 12.1 06/19/2018   HCT 36.9 06/19/2018   MCV 94.1 06/19/2018   PLT 227 06/19/2018   BMET    Component Value Date/Time   NA 140 06/19/2018 0605   K 3.9 06/19/2018 0605   CL 100 06/19/2018 0605   CO2 30 06/19/2018 0605   GLUCOSE 117 (H) 06/19/2018 0605   BUN 25 (H) 06/19/2018 0605   CREATININE 0.81 06/19/2018 1830   CALCIUM 9.2 06/19/2018 0605   GFRNONAA >60 06/19/2018 1830   GFRAA >60 06/19/2018 1830     Assessment/Plan: 2 Days Post-Op   Principal Problem:   Tibia/fibula fracture, right, closed, initial encounter Active Problems:   Essential hypertension   Dementia (HCC)   Seizures (HCC)   Open displaced comminuted fracture of shaft of right tibia   WBAT with walker for transfers Ice, elevation DVT ppx: Lovenox, SCDs, TEDS PO pain control PT/OT Dispo: SNF placement   Iline Oven Prarthana Parlin 06/21/2018, 2:17 PM   Samson Frederic, MD Cell: [phone] [facility] is now [facility]  [place] [address], [address], [place], [place] [address] Phone: [phone] [email or web address] Facebook  [employer]

## 2018-06-21 NOTE — Progress Notes (Signed)
CSW spoke with Dahlia Byes who reports he accepts bed offer at [facility].   [facility] has initiated Fire[facility] today with [employer].   CSW will follow up.   [place], [place] [phone]

## 2018-06-22 MED ORDER — ENOXAPARIN SODIUM 30 MG/0.3ML ~~LOC~~ SOLN: 30.0000 mg | SUBCUTANEOUS | 0 refills | Status: AC

## 2018-06-22 MED ORDER — DOCUSATE SODIUM 100 MG PO CAPS: 100.0000 mg | ORAL_CAPSULE | Freq: Two times a day (BID) | ORAL | 0 refills | Status: AC

## 2018-06-22 MED ORDER — HYDROCODONE-ACETAMINOPHEN 5-325 MG PO TABS: 1.0000 | ORAL_TABLET | ORAL | 0 refills | Status: AC | PRN

## 2018-06-22 MED ORDER — LAMOTRIGINE 25 MG PO TABS: 50.0000 mg | ORAL_TABLET | Freq: Two times a day (BID) | ORAL | 0 refills | Status: AC

## 2018-06-22 NOTE — Progress Notes (Signed)
Patient will DC to: C[facility] Anticipated DC date: 03-19-1923 Family notified: [name]) Transport DH:[id number]  Per MD patient ready for DC to [facility]. RN, patient, patient's family, and facility notified of DC. Discharge Summary sent to facility. RN given number for report [phone]. DC packet on chart. Ambulance transport requested for patient.  CSW signing off.  [place], [place] [phone]

## 2018-06-22 NOTE — Plan of Care (Signed)
  Problem: Activity: Goal: Ability to increase mobility will improve Outcome: Progressing   Problem: Pain Management: Goal: Pain level will decrease with appropriate interventions Outcome: Progressing   Problem: Skin Integrity: Goal: Will show signs of wound healing Outcome: Progressing   

## 2018-06-22 NOTE — Care Management Important Message (Signed)
Important Message  Patient Details  Name: Cathy Morton MRN: [id number] Date of Birth: 12/20/1922   Medicare Important Message Given:  Yes    Yaelis Scharfenberg 06/22/2018, 3:01 PM

## 2018-06-22 NOTE — Progress Notes (Signed)
Occupational Therapy Treatment Patient Details Name: Cathy Morton MRN: [id number] DOB: 17-Feb-1923 Today's Date: 06/22/2018    History of present illness Pt is a 85 y.o. female admitted 06/18/18 after falling at home sustaining R tib/fib fx; s/p intramedullary fixation of R tibia on 06/19/18. PMH includes dementia, HTN, seizures, L1 compression fx (2017).   OT comments  Pt progressing towards OT goals this session. Pt agreeable to EOB and grooming, max A for bed mobility and min A for grooming tasks. Pt declined OOB "I am supposed to be getting out of here" PT continues to benefit from skilled OT and SNF placement remains essential for safety and to maximize independence in ADL and transfers.    Follow Up Recommendations  SNF;Supervision/Assistance - 24 hour    Equipment Recommendations  Other (comment)(defer to next venue of care)    Recommendations for Other Services      Precautions / Restrictions Precautions Precautions: Fall Restrictions Weight Bearing Restrictions: Yes RLE Weight Bearing: Weight bearing as tolerated Other Position/Activity Restrictions: WBAT for transfers only       Mobility Bed Mobility Overal bed mobility: Needs Assistance Bed Mobility: Supine to Sit;Sit to Supine     Supine to sit: Max assist Sit to supine: Max assist   General bed mobility comments: Pt able to initiate movement to EOB, but quick to stop moving once RLE starts hurting, eventually requiring maxA to assist BLEs to EOB; modA for UE support to assist trunk elevation. Assist for BLE back into bed, heavy use of bed pad to re-position at bed level  Transfers                 General transfer comment: NT this session    Balance Overall balance assessment: Needs assistance;History of Falls Sitting-balance support: No upper extremity supported Sitting balance-Leahy Scale: Fair Sitting balance - Comments: EOB able to engage in dynamic movement without LOB min guard for safety                                    ADL either performed or assessed with clinical judgement   ADL Overall ADL's : Needs assistance/impaired     Grooming: Oral care;Wash/dry face;Minimal assistance;Sitting Grooming Details (indicate cue type and reason): pre-loaded toothbrush, cues for task completion                             Functional mobility during ADLs: Moderate assistance;+2 for physical assistance;Rolling walker       Vision       Perception     Praxis      Cognition Arousal/Alertness: Awake/alert Behavior During Therapy: Flat affect Overall Cognitive Status: History of cognitive impairments - at baseline                                          Exercises     Shoulder Instructions       General Comments      Pertinent Vitals/ Pain       Pain Assessment: Faces Faces Pain Scale: Hurts little more Pain Location: R lower leg Pain Descriptors / Indicators: Grimacing;Guarding Pain Intervention(s): Limited activity within patient's tolerance;Monitored during session;Repositioned  Home Living  Prior Functioning/Environment              Frequency  Min 2X/week        Progress Toward Goals  OT Goals(current goals can now be found in the care plan section)  Progress towards OT goals: Progressing toward goals  Acute Rehab OT Goals Patient Stated Goal: Less pain OT Goal Formulation: With patient Time For Goal Achievement: 07/04/18 Potential to Achieve Goals: Good  Plan Discharge plan remains appropriate;Frequency remains appropriate    Co-evaluation                 AM-PAC OT "6 Clicks" Daily Activity     Outcome Measure   Help from another person eating meals?: A Little Help from another person taking care of personal grooming?: A Little Help from another person toileting, which includes using toliet, bedpan, or urinal?: A Lot Help from  another person bathing (including washing, rinsing, drying)?: A Lot Help from another person to put on and taking off regular upper body clothing?: A Lot Help from another person to put on and taking off regular lower body clothing?: A Lot 6 Click Score: 14    End of Session    OT Visit Diagnosis: Unsteadiness on feet (R26.81);Other abnormalities of gait and mobility (R26.89);Muscle weakness (generalized) (M62.81);Other symptoms and signs involving cognitive function;Pain Pain - Right/Left: Right Pain - part of body: Leg   Activity Tolerance Patient limited by pain   Patient Left in bed;with call bell/phone within reach;with bed alarm set   Nurse Communication Mobility status;Weight bearing status        Time: [id number] OT Time Calculation (min): 16 min  Charges: OT General Charges $OT Visit: 1 Visit OT Treatments $Self Care/Home Management : 8-22 mins  Sherryl MangesLaura Alonna Bartling OTR/L Acute Rehabilitation Services Pager: [phone] Office: [phone]   Cathy Morton 06/22/2018, 4:10 PM

## 2018-06-22 NOTE — Discharge Summary (Signed)
Physician Discharge Summary  Patient ID: Cathy Morton MRN: [id number] DOB/AGE: [id number] y.o.  Admit date: 06/18/2018 Discharge date: 06/22/2018  Admission Diagnoses:  Discharge Diagnoses:  Principal Problem:   Tibia/fibula fracture, right, closed, initial encounter Active Problems:   Essential hypertension   Dementia (HCC)   Seizures (HCC)   Open displaced comminuted fracture of shaft of right tibia   Discharged Condition: stable  Hospital Course: Cathy Morton a 85 year old female with past medical history significant for dementia, hypertension and seizures.  Patient tripped and fell falling while arguing with the sitter, with subsequent closed right tibia-fibula fracture.  Patient underwent Intramedullary fixation of right tibia and closed treatment of right fibula shaft fracture on 06/19/2018.  Patient has remained stable.  Patient will follow with the primary care provider and orthopedic team within 1 week of discharge.  Closed right tibia/fibula fracture: -Patient has undergone Intramedullary fixation of right tibia and closed treatment of right fibula shaft fracture. -Orthopedic team directed postoperative management.  -Optimize pain control. -Patient will be discharged to skilled nursing facility.  Essential hypertension: Continue to monitor and optimize.  Dementia: Stable. No behavioral problems noted.  Seizures: Seizure precautions during the hospital stay.   No seizure activity noted.    Consults: orthopedic surgery  Significant Diagnostic Studies:  Tibia-fibula's x-ray revealed "Acute oblique comminuted fracture of the distal right tibial shaft with medial and posterior displacement.  Acute oblique minimally displaced fracture of the proximal right fibular shaft with medial and anterior displacement.  Discharge Exam: Blood pressure (!) 150/65, pulse (!) 55, temperature 99 F (37.2 C), temperature source Oral, resp. rate 14, height 5\' 5"  (1.651 m),  weight 43.6 kg, SpO2 100 %.   Disposition: Discharge disposition: 03-Skilled Nursing Facility    Discharge Instructions    Diet - low sodium heart healthy   Complete by:  As directed    Increase activity slowly   Complete by:  As directed      Allergies as of 06/22/2018      Reactions   Tramadol Other (See Comments)   Per Son, he does not want her to have this medication at all      Medication List    STOP taking these medications   PRESERVISION AREDS 2+MULTI VIT PO     TAKE these medications   carboxymethylcellulose 0.5 % Soln Commonly known as:  REFRESH PLUS Place 1 drop into both eyes 3 (three) times daily as needed (dry eyes).   diclofenac sodium 1 % Gel Commonly known as:  VOLTAREN Apply 2 g topically 4 (four) times daily as needed (back pain).   docusate sodium 100 MG capsule Commonly known as:  COLACE Take 1 capsule (100 mg total) by mouth 2 (two) times daily.   donepezil 10 MG tablet Commonly known as:  ARICEPT Take 10 mg by mouth at bedtime.   enoxaparin 30 MG/0.3ML injection Commonly known as:  LOVENOX Inject 0.3 mLs (30 mg total) into the skin daily. Start taking on:  June 23, 2018   HYDROcodone-acetaminophen 5-325 MG tablet Commonly known as:  NORCO/VICODIN Take 1-2 tablets by mouth every 4 (four) hours as needed for moderate pain (pain score 4-6).   lamoTRIgine 25 MG tablet Commonly known as:  LAMICTAL Take 2 tablets (50 mg total) by mouth 2 (two) times daily. Take two tablets by mouth twice a day   levothyroxine 100 MCG tablet Commonly known as:  SYNTHROID, LEVOTHROID Take 100 mcg by mouth daily before breakfast.   losartan-hydrochlorothiazide 100-12.5  MG tablet Commonly known as:  HYZAAR Take 1 tablet by mouth daily.   multivitamin with minerals Tabs tablet Take 1 tablet by mouth daily.      Follow-up Information    Swinteck, Arlys John, MD. Schedule an appointment as soon as possible for a visit in 2 weeks.   Specialty:  Orthopedic  Surgery Why:  For suture removal, For wound re-check Contact information: [address] [place] [address] [phone]           Signed: Barnetta Chapel 06/22/2018, 11:38 AM

## 2018-06-22 NOTE — Progress Notes (Signed)
Patient discharging to [facility]. Attempt x2 to call and give report to facility was unsuccessful. Awaiting foer [facility] to transport.

## 2018-06-22 NOTE — Clinical Social Work Placement (Signed)
   CLINICAL SOCIAL WORK PLACEMENT  NOTE  Date:  06/22/2018  Patient Details  Name: Cathy Morton MRN: [id number] Date of Birth: [birth year]  Clinical Social Work is seeking post-discharge placement for this patient at the Skilled  Nursing Facility level of care (*CSW will initial, date and re-position this form in  chart as items are completed):  Yes   Patient/family provided with [facility] Clinical Social Work Department's list of facilities offering this level of care within the geographic area requested by the patient (or if unable, by the patient's family).  Yes   Patient/family informed of their freedom to choose among providers that offer the needed level of care, that participate in Medicare, Medicaid or managed care program needed by the patient, have an available bed and are willing to accept the patient.  Yes   Patient/family informed of [facility]'s ownership interest in [facility], as well as of the fact that they are under no obligation to receive care at these facilities.  PASRR submitted to EDS on       PASRR number received on       Existing PASRR number confirmed on 06/20/18     FL2 transmitted to all facilities in geographic area requested by pt/family on 06/20/18     FL2 transmitted to all facilities within larger geographic area on       Patient informed that his/her managed care company has contracts with or will negotiate with certain facilities, including the following:        Yes   Patient/family informed of bed offers received.  Patient chooses bed at [facility]     Physician recommends and patient chooses bed at      Patient to be transferred to [facility] on 06/22/18.  Patient to be transferred to facility by [facility]     Patient family notified on 06/22/18 of transfer.  Name of family member notified:  Tammy Sours [facility])     PHYSICIAN Please sign DNR     Additional Comment:     _______________________________________________ Gildardo Griffes, LCSW 06/22/2018, 11:59 AM

## 2018-06-22 NOTE — Progress Notes (Signed)
Pt's vital signs are stable, no signs of distress, discharge instructions and personal belongings given to EMS.

## 2018-11-29 DEATH — deceased

## 2019-03-19 ENCOUNTER — Ambulatory Visit: Payer: Medicare Other | Admitting: Neurology

## 2019-08-21 IMAGING — CR DG HIP (WITH OR WITHOUT PELVIS) 2-3V*L*
3 series · 3 of 3 positions shown · non-contrast
Comparison: Abdomen and pelvis CT, 01/13/2018.

CLINICAL DATA: History of seizures.  Unsteady gait.

EXAM:
DG HIP (WITH OR WITHOUT PELVIS) 2-3V LEFT

[pelvis ap]
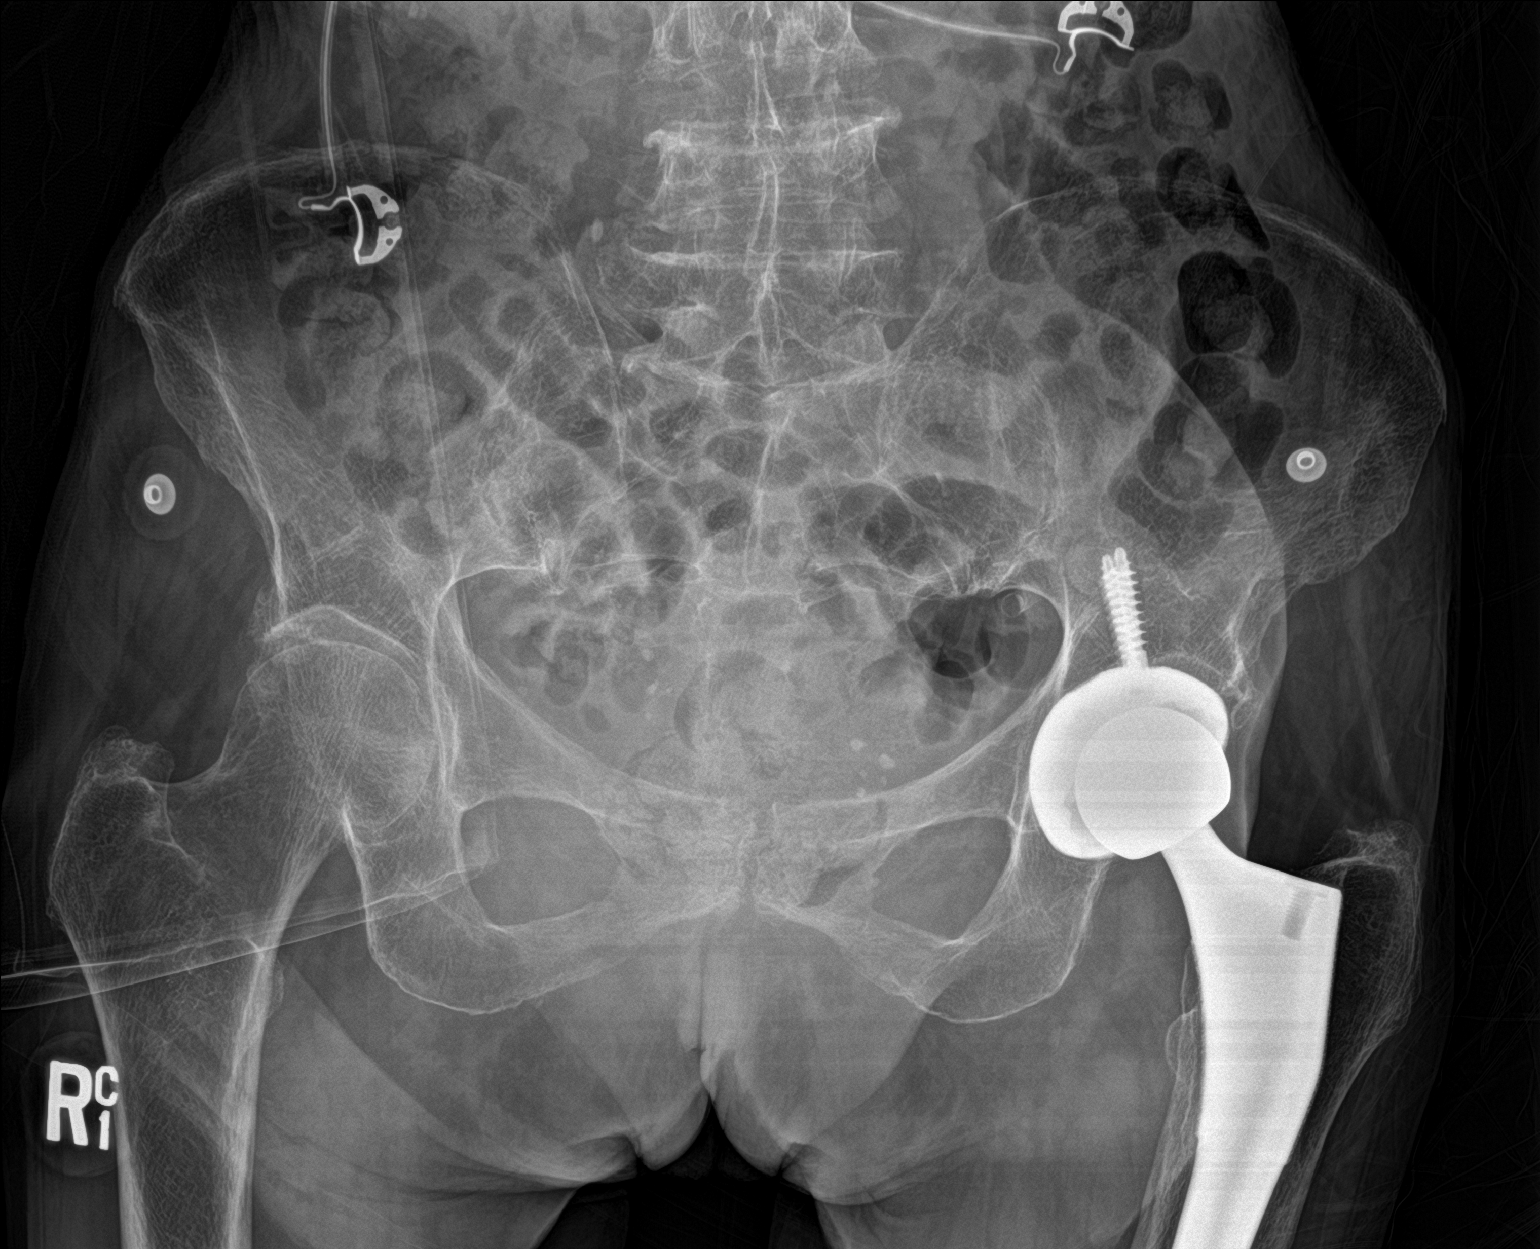

[hip ap]
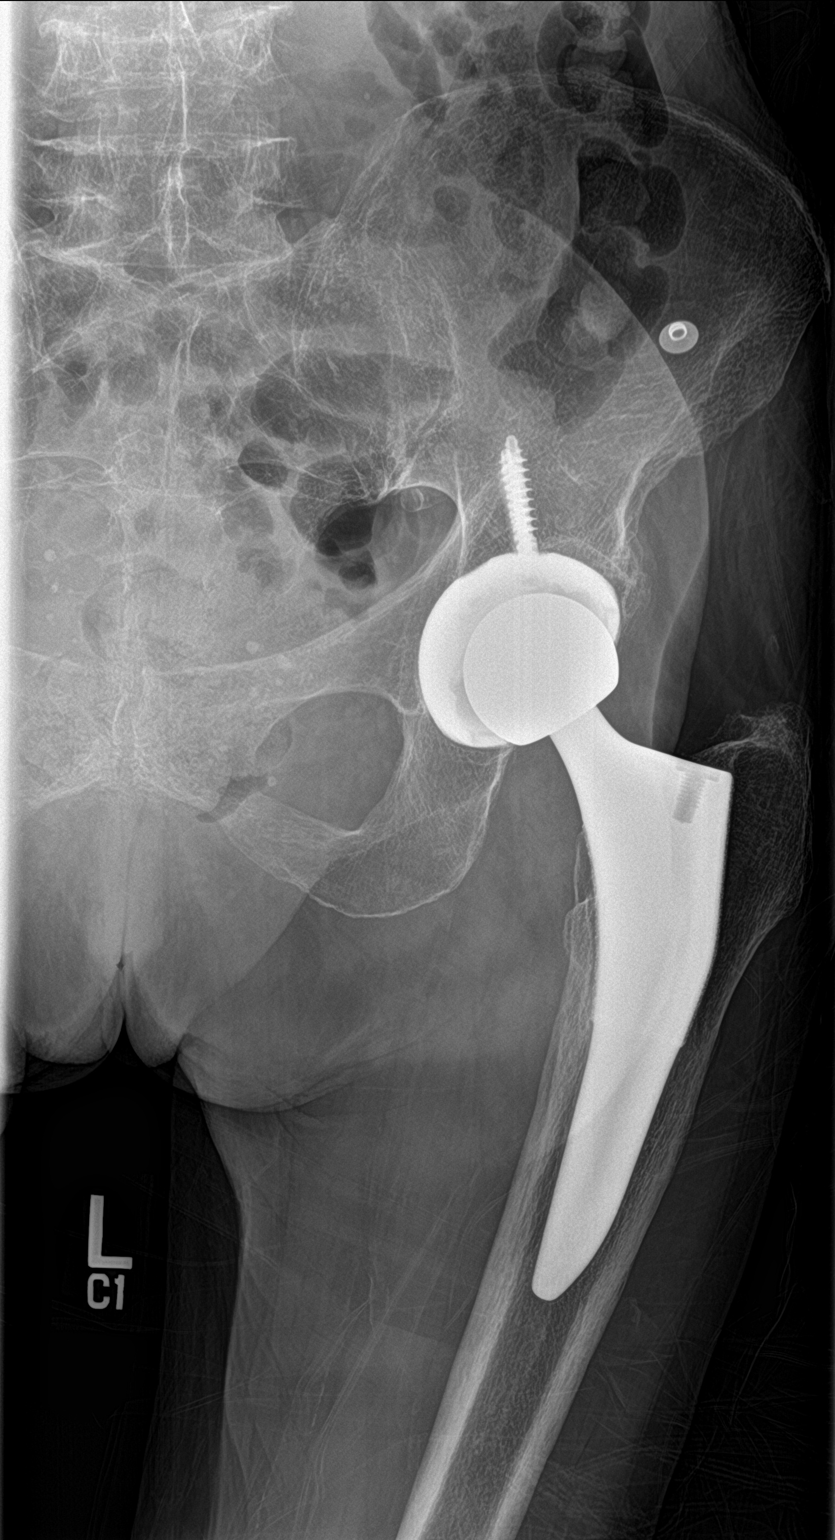

[hip lat]
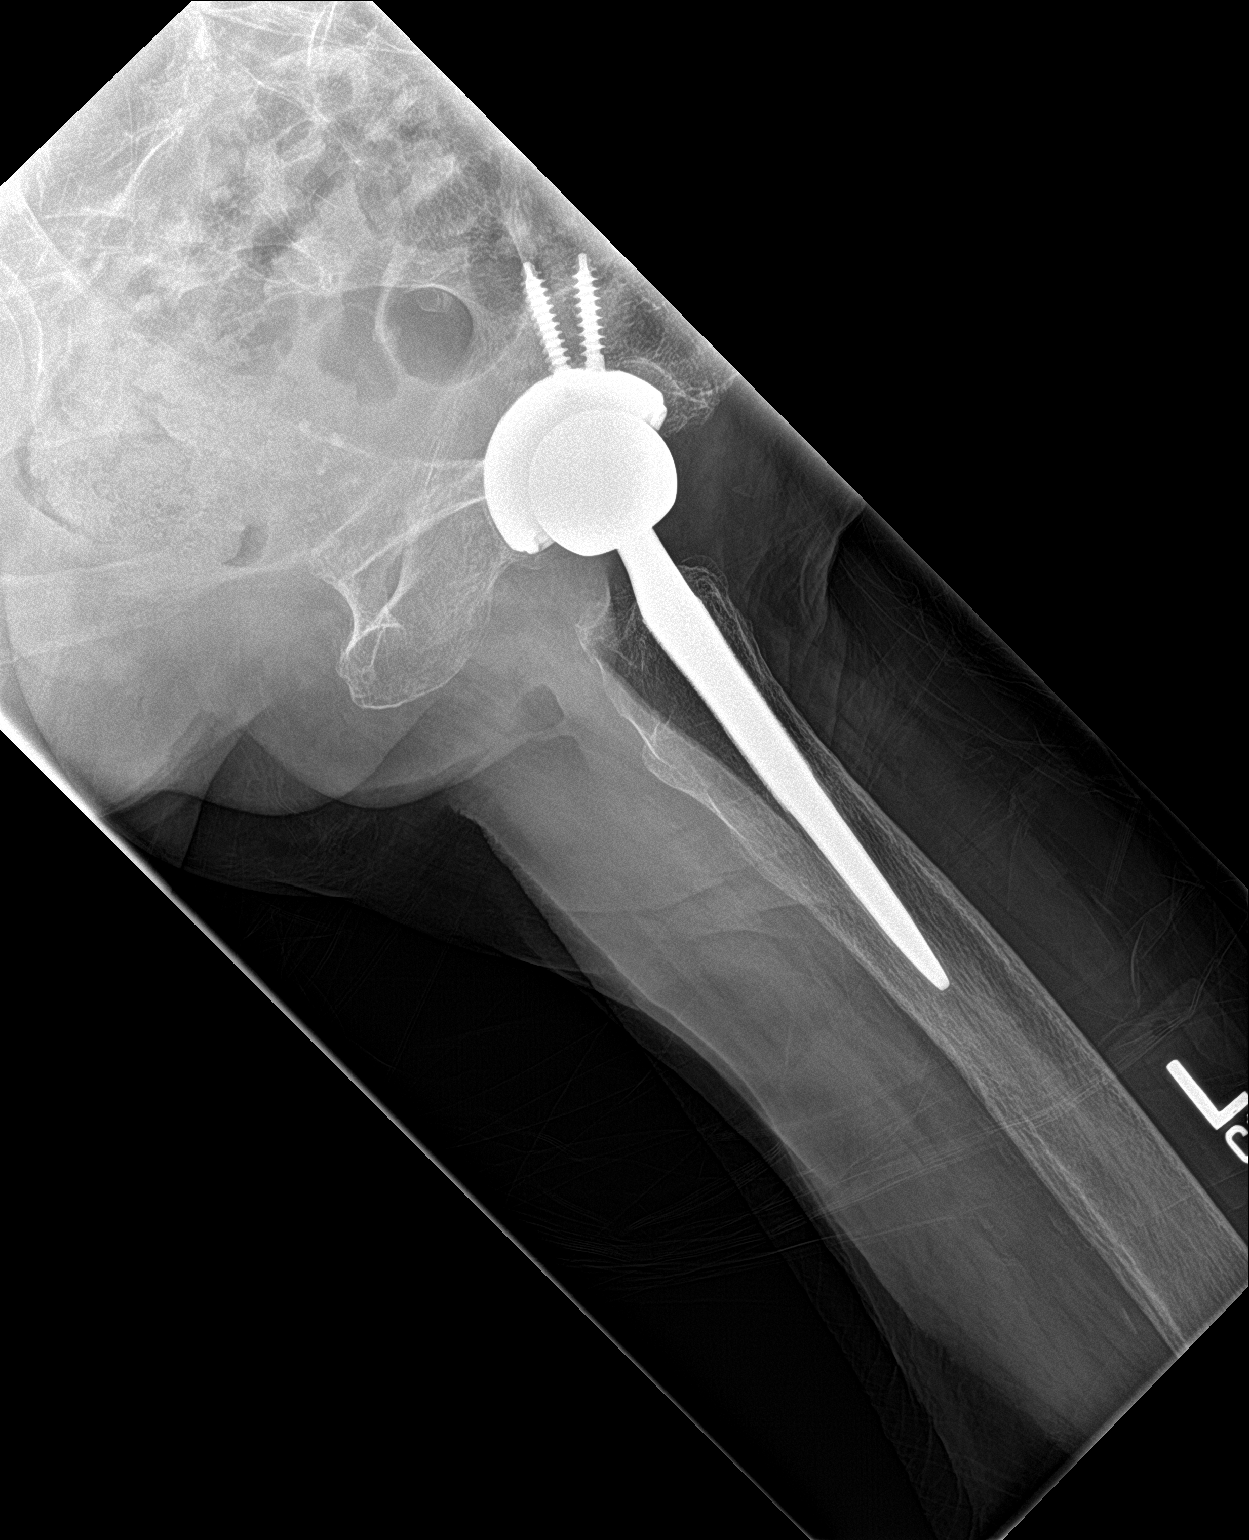

[3 of 3 positions shown; findings below may reference images not displayed]

FINDINGS: Compression fracture of L4 which appears new since the prior CT.

No other evidence of a fracture. No bone lesion. Skeletal structures
are demineralized.

Left total hip arthroplasty is well-seated and aligned.

Mild hip joint arthropathic changes. SI joints and symphysis pubis
are normally spaced and aligned.

Skeletal structures are demineralized.
IMPRESSION: 1. Fracture of L4, which appears new since the prior CT of the
abdomen pelvis dated 01/13/2018. However, there are lumbar spine
radiographs dated 04/04/2018, where this fracture is present.
2. No other fractures.
3. Left hip total arthroplasty is well-seated and aligned.

## 2019-10-08 IMAGING — CR DG TIBIA/FIBULA 2V*R*
2 series · 2 of 2 positions shown · non-contrast
Comparison: None.

CLINICAL DATA: Initial evaluation for acute trauma, fall.

EXAM:
RIGHT TIBIA AND FIBULA - 2 VIEW

[tibia ap]
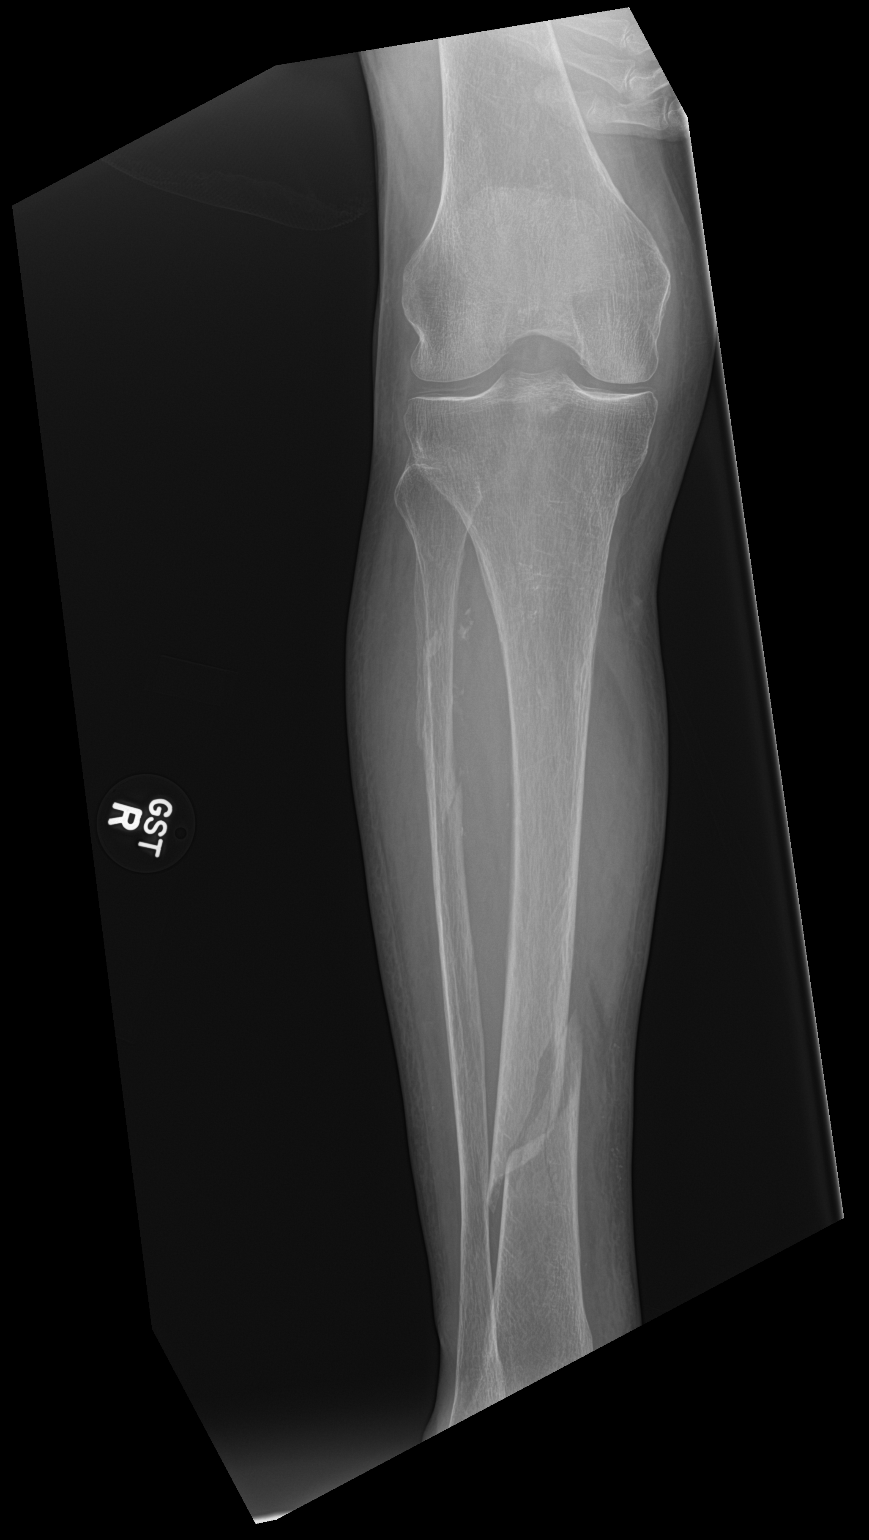

[tibia lat]
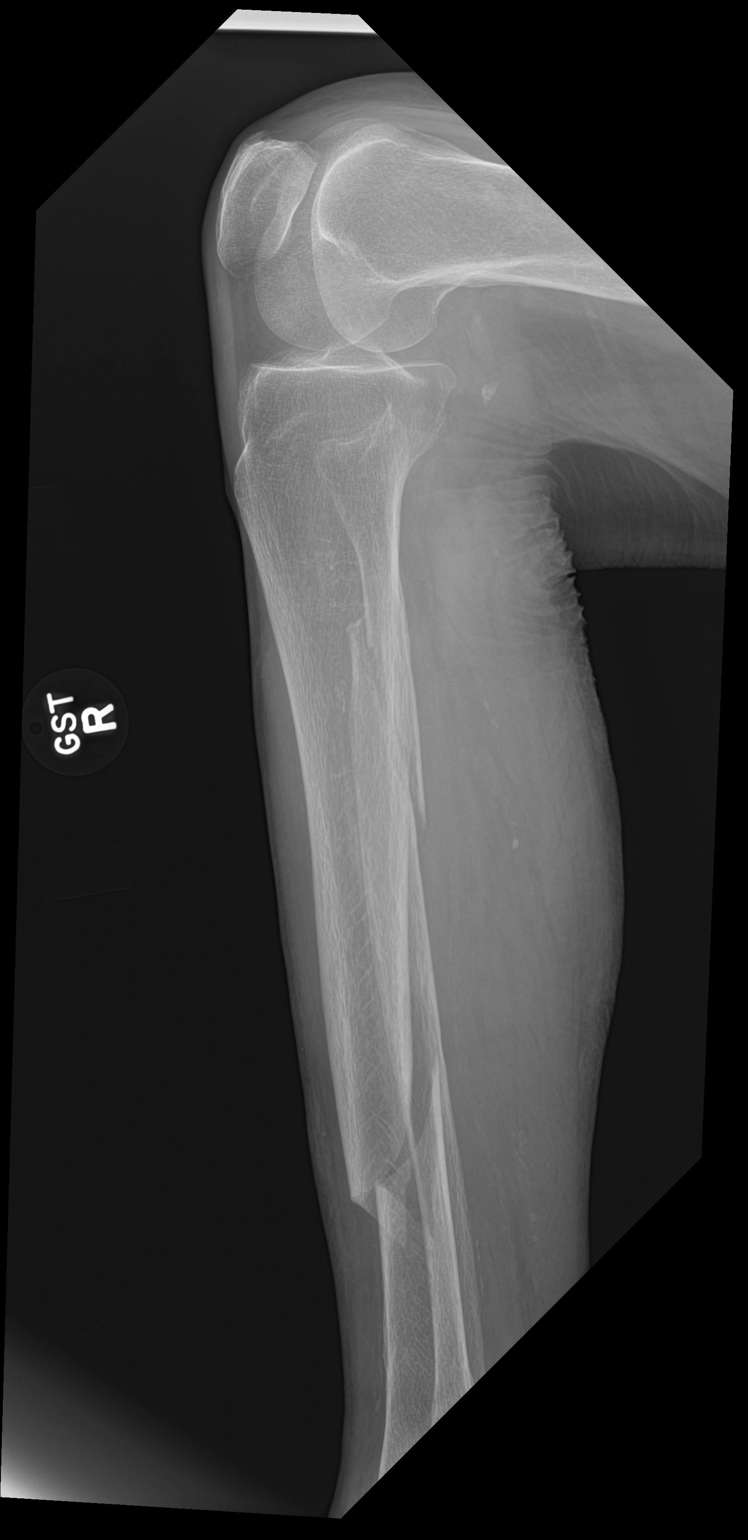

[2 of 2 positions shown; findings below may reference images not displayed]

FINDINGS: Acute oblique mildly comminuted fracture of the distal right tibial
shaft with slight medial and posterior displacement. Additional
acute oblique fracture of the proximal right fibular shaft with
slight anterior and medial displacement. Underlying osteopenia
noted. Associated mild diffuse soft tissue swelling. Vascular
calcifications noted.
IMPRESSION: 1. Acute oblique comminuted fracture of the distal right tibial
shaft with medial and posterior displacement.
2. Acute oblique minimally displaced fracture of the proximal right
fibular shaft with medial and anterior displacement.
